# Patient Record
Sex: Female | Born: 1990 | Race: Black or African American | Hispanic: No | Marital: Single
Health system: Midwestern US, Community
[De-identification: ages and names within clinical notes are randomized; demographics above are authoritative.]

## PROBLEM LIST (undated history)

## (undated) DIAGNOSIS — S59902A Unspecified injury of left elbow, initial encounter: Secondary | ICD-10-CM

## (undated) DIAGNOSIS — F329 Major depressive disorder, single episode, unspecified: Secondary | ICD-10-CM

## (undated) DIAGNOSIS — F32A Depression, unspecified: Secondary | ICD-10-CM

## (undated) HISTORY — DX: Major depressive disorder, single episode, unspecified: F32.9

## (undated) HISTORY — DX: Depression, unspecified: F32.A

---

## 2014-12-10 ENCOUNTER — Inpatient Hospital Stay
Admit: 2014-12-10 | Discharge: 2014-12-10 | Disposition: A | Discharge status: Home / Self Care | Payer: BLUE CROSS/BLUE SHIELD | Attending: Emergency Medical Services

## 2014-12-10 DIAGNOSIS — R1084 Generalized abdominal pain: Secondary | ICD-10-CM

## 2014-12-10 LAB — CBC WITH AUTOMATED DIFF
BASOPHILS: 0.3 % (ref 0–3)
EOSINOPHILS: 2.4 % (ref 0–5)
HCT: 37.5 % (ref 37.0–50.0)
HGB: 13.1 gm/dl (ref 13.0–17.2)
IMMATURE GRANULOCYTES: 0.3 % (ref 0.0–3.0)
LYMPHOCYTES: 28.4 % (ref 28–48)
MCH: 31.6 pg (ref 25.4–34.6)
MCHC: 34.9 gm/dl (ref 30.0–36.0)
MCV: 90.4 fL (ref 80.0–98.0)
MONOCYTES: 12.6 % (ref 1–13)
MPV: 9.8 fL (ref 6.0–10.0)
NEUTROPHILS: 56 % (ref 34–64)
NRBC: 0 (ref 0–0)
PLATELET: 224 10*3/uL (ref 140–450)
RBC: 4.15 M/uL (ref 3.60–5.20)
RDW-SD: 44.7 (ref 36.4–46.3)
WBC: 7 10*3/uL (ref 4.0–11.0)

## 2014-12-10 LAB — METABOLIC PANEL, COMPREHENSIVE
ALT (SGPT): 21 U/L (ref 12–78)
AST (SGOT): 15 U/L (ref 15–37)
Albumin: 3.7 gm/dl (ref 3.4–5.0)
Alk. phosphatase: 102 U/L (ref 45–117)
BUN: 8 mg/dl (ref 7–25)
Bilirubin, total: 0.5 mg/dl (ref 0.2–1.0)
CO2: 25 mEq/L (ref 21–32)
Calcium: 9.4 mg/dl (ref 8.5–10.1)
Chloride: 106 mEq/L (ref 98–107)
Creatinine: 0.8 mg/dl (ref 0.6–1.3)
GFR est AA: >60
GFR est non-AA: >60
Glucose: 77 mg/dl (ref 74–106)
Potassium: 3.9 mEq/L (ref 3.5–5.1)
Protein, total: 8.2 gm/dl (ref 6.4–8.2)
Sodium: 139 mEq/L (ref 136–145)

## 2014-12-10 LAB — LIPASE: Lipase: 186 U/L (ref 73–393)

## 2014-12-10 LAB — POC URINE MACROSCOPIC
Bilirubin: NEGATIVE
Glucose: NEGATIVE mg/dl
Ketone: NEGATIVE mg/dl
Leukocyte Esterase: NEGATIVE
Nitrites: NEGATIVE
Protein: NEGATIVE mg/dl
Specific gravity: >=1.03 (ref 1.005–1.030)
Urobilinogen: 1 EU/dl (ref 0.0–1.0)
pH (UA): 7.5 (ref 5–9)

## 2014-12-10 LAB — POC FECAL OCCULT BLOOD
Occult blood, stool (POC): NEGATIVE
Occult blood, stool: NEGATIVE — AB

## 2014-12-10 LAB — POC HCG,URINE: HCG urine, QL: NEGATIVE

## 2014-12-10 MED ORDER — DICYCLOMINE 10 MG CAP
10 mg | ORAL | Status: AC
Start: 2014-12-10 — End: 2014-12-10
  Administered 2014-12-10: 16:00:00 via ORAL

## 2014-12-10 MED ORDER — ONDANSETRON (PF) 4 MG/2 ML INJECTION
4 mg/2 mL | Freq: Once | INTRAMUSCULAR | Status: AC
Start: 2014-12-10 — End: 2014-12-10
  Administered 2014-12-10: 16:00:00 via INTRAVENOUS

## 2014-12-10 MED ORDER — ONDANSETRON 4 MG TAB, RAPID DISSOLVE
4 mg | ORAL_TABLET | Freq: Three times a day (TID) | ORAL | Status: DC | PRN
Start: 2014-12-10 — End: 2019-05-07

## 2014-12-10 MED ORDER — SODIUM CHLORIDE 0.9 % IJ SYRG
Freq: Once | INTRAMUSCULAR | Status: AC
Start: 2014-12-10 — End: 2014-12-10
  Administered 2014-12-10: 16:00:00 via INTRAVENOUS

## 2014-12-10 MED ORDER — DICYCLOMINE 20 MG TAB
20 mg | ORAL_TABLET | Freq: Four times a day (QID) | ORAL | Status: DC
Start: 2014-12-10 — End: 2019-05-07

## 2014-12-10 MED FILL — ONDANSETRON (PF) 4 MG/2 ML INJECTION: 4 mg/2 mL | INTRAMUSCULAR | Qty: 2

## 2014-12-10 MED FILL — DICYCLOMINE 10 MG CAP: 10 mg | ORAL | Qty: 2

## 2014-12-10 NOTE — ED Notes (Signed)
One month h/o constipation, has tried laxatives, on Friday started having pasty hard black stools, only small like pebbles. Abdominal pain x 1 month, nausea

## 2014-12-10 NOTE — ED Provider Notes (Signed)
[facility]  EMERGENCY DEPARTMENT TREATMENT REPORT  NAME:  Mandy Chen  SEX:   F  ADMIT: 12/10/2014  DOB:   May 24, 1990  MR#    [id number]  ROOM:  AV40  TIME DICTATED: 11 30 AM  ACCT#  [id number]        TIME OF EVALUATION:  1000    CHIEF COMPLAINT:  Constipation, abdominal pain, black stool.    HISTORY OF PRESENT ILLNESS:  This 24 year old female complains of abdominal pain and constipation for the   past month.   Sporadically throughout the day she has generalized crampy,   sharp pain to her abdomen.  Pain occurs spontaneously, lasts for several hours   before self-resolving.  No identifiable exacerbating factors.  The pain does   not worsen with food.  Associated nausea, but no vomiting and she has been   eating and drinking well.  Pain does resolve with Pepto-Bismol which she has   been taking almost daily.  Associated constipation.  Does move her bowels once   a day or once every other day, but bowel movements are extremely small.    Stool is soft.   Feels like she should have larger bowel movements given the   amount of food she eats a day.   Has tried over-the-counter Dulcolax and   increasing the fiber in her diet with no real change.  Two days ago noticed   that her stool began to look black, which was very concerning, and why she has   finally come to the Emergency Department today.  Since starting college in   2015 she has occasionally had similar type abdominal discomfort and she always   related it to possible stress or anxiety, but has never been this severe or   this persistent.    REVIEW OF SYSTEMS:  CONSTITUTIONAL:  No fever, chills, or sweats.  GASTROINTESTINAL:  As above.  No diarrhea.  GENITOURINARY:  No hematochezia.  GENITOURINARY:  No dysuria, frequency, or urgency.    MUSCULOSKELETAL:  No back or flank pain.    PAST MEDICAL HISTORY:  Anxiety, depression, seasonal allergies.    SOCIAL HISTORY:  No tobacco use, consumes alcohol occasionally.  Denies recreational drug use.     Last menstrual period 12/06/2014.  Currently on her menstrual cycle.    ALLERGIES:  NONE.    MEDICATIONS:  Lexapro, Wellbutrin, Flonase, One-A-Day multivitamin.    PHYSICAL EXAMINATION:  VITAL SIGNS:  BP 141/60, pulse 96, respirations 18, temperature 98.4, pain 10,   O2 saturation 100% on room air.  GENERAL APPEARANCE:  Well developed, well nourished.  HEENT:   Eyes:  Conjunctivae clear, lids normal.  Pupils equal, symmetrical,   and normally reactive.  Mouth/Throat:  Surfaces of the pharynx, palate, and   tongue are pink, moist, and without lesions.    RESPIRATORY:   Lungs clear.  CARDIOVASCULAR:  Regular rate and rhythm.  GASTROINTESTINAL:  Abdomen is soft,  nondistended, active bowel sounds present   in all 4 quadrants.  Abdomen nontender with palpation in all 4 quadrants.  No   rebound, guarding or peritonitis.  No abdominal or inguinal masses   appreciated by inspection or palpation.  No hepatomegaly or splenomegaly.    Bilaterally, no CVA tenderness.  RECTAL:  No masses or hemorrhoids.  Sphincter tone is normal.  No large amount   of stool is appreciable in the rectal vault.  Stool is black-appearing,   guaiac negative.  MUSCULOSKELETAL:  Moves all 4 extremities spontaneously.  SKIN:  Warm and dry.  Mucous membranes have good pink undertone.    CONTINUATION BY Verlin GrillsOLLEEN OLEARY, PA:      ASSESSMENT AND MANAGEMENT PLAN:    This is a new problem for this patient.      DIAGNOSTIC INTERPRETATIONS:  Urine pregnancy negative.  Urine microscopic large blood, otherwise   unremarkable.  CBC, CMP and lipase all unremarkable.    COURSE IN THE EMERGENCY DEPARTMENT:  The patient looks well.  Presents concerned for constipation.  She has   actually been having a bowel movement almost daily, although does sound like   the amount of stool that is being passed is less than her norm.  On rectal   exam, a small amount of stool that was present did look black, but it was    guaiac negative.  I suspect her stools have turned black from Pepto-Bismol   use.  There is not a significant amount of stool in her rectal vault, so I do   not think she would benefit from an enema.  She was not having any abdominal   pain on my initial exam and her abdomen was completely benign.  I am going to   check a CBC, CMP, lipase and urine to assess for any signs of infectious   process, rule out pancreatitis, check her LFTs, her kidney function and   electrolytes.  Urine had large blood which is to be expected given that she is   currently on her menstrual cycle.  Urine was negative for infection.  All of   her blood work was also unremarkable.      Medicated during her ER stay with 4 mg Zofran IV and 20 mg of Bentyl p.o.  She   tolerated the medicine without any issues.  When I reexamined her, she   continued to look great in the room and had no return of her abdominal pain   throughout her stay.  I reexamined her abdomen and it was still completely   benign.  I discussed with her and her mom, who is at bedside, that given her   benign abdomen, how well she looks and her unremarkable workup, I have no   suspicion for any acute or life-threatening intra-abdominal process.  I do not   suspect a GI bleed or a bowel obstruction.  I have recommended that she   follow up with a gastroenterologist for further evaluation.  She should   encourage fluids at home.  We will discharge her with Bentyl and Zofran   prescriptions as needed for additional abdominal pain and nausea.  They are   both very reassured, happy and agreeable with this plan.    DIAGNOSES:    Generalized abdominal pain.    FINAL DIAGNOSIS:   Nausea without vomiting.    DISPOSITION AND PLAN:  The patient discharged home in stable condition with verbal instructions for   ongoing care.  Instructed to follow up with referral gastroenterologist as   soon as possible for further evaluation.  They are aware they may return at    any time for new or worsening symptoms.  The patient was personally evaluated   by myself and Dr. Damien Fusiavid Nikkole Placzek, who agrees with the above assessment and   plan.      ___________________  Elsie Saasavid A Arnesia Vincelette MD  Dictated By: Verlin Grillsolleen Oleary, PA    My signature above authenticates this document and my orders, the final   diagnosis (es), discharge prescription (s),  and instructions in the Epic   record.  If you have any questions please contact [phone].    Nursing notes have been reviewed by the physician/ advanced practice   clinician.    [facility]  D:12/10/2014 11:30:16  T: 12/10/2014 16:15:48  [id number]

## 2015-12-25 ENCOUNTER — Encounter: Payer: Self-pay | Admitting: Podiatry

## 2015-12-25 ENCOUNTER — Telehealth: Payer: Self-pay | Admitting: *Deleted

## 2015-12-25 ENCOUNTER — Ambulatory Visit (INDEPENDENT_AMBULATORY_CARE_PROVIDER_SITE_OTHER): Payer: Federal, State, Local not specified - PPO

## 2015-12-25 ENCOUNTER — Ambulatory Visit (INDEPENDENT_AMBULATORY_CARE_PROVIDER_SITE_OTHER): Payer: Federal, State, Local not specified - PPO | Admitting: Podiatry

## 2015-12-25 VITALS — BP 90/55 | HR 73 | Resp 16

## 2015-12-25 DIAGNOSIS — M2141 Flat foot [pes planus] (acquired), right foot: Secondary | ICD-10-CM | POA: Diagnosis not present

## 2015-12-25 DIAGNOSIS — M66871 Spontaneous rupture of other tendons, right ankle and foot: Secondary | ICD-10-CM

## 2015-12-25 DIAGNOSIS — M79671 Pain in right foot: Secondary | ICD-10-CM

## 2015-12-25 DIAGNOSIS — M76821 Posterior tibial tendinitis, right leg: Secondary | ICD-10-CM

## 2015-12-25 NOTE — Progress Notes (Signed)
   Subjective:    Patient ID: Ann Mitchell, female    DOB: 02/11/1991, 25 y.o.   MRN: [id number]  HPI: She presents today as a 25 year old female with pain to her right foot along the medial longitudinal aspect of the foot. She states it is then aching on and off the sharp pains for the past several months with some swelling and she states that it seems to be worse after sitting and driving for a protracted period of time. She's tried Dr. Margart SicklesScholl's over-the-counter insoles and bracing nothing seems to help. She denies any trauma.    Review of Systems  Musculoskeletal: Positive for back pain.  Allergic/Immunologic: Positive for environmental allergies and food allergies.  All other systems reviewed and are negative.      Objective:   Physical Exam: Vital signs are stable she is alert and oriented 3 no apparent distress. Pulses are strongly palpable. Neurologic sensorium is intact. Deep tendon reflexes are intact muscle strength +5 over 5 dorsiflexion plantar flexors and inverters everters MUSCULATURES INTACT. ORTHOPEDIC EVALUATION DEMONSTRATES ALL JOINTS IS FULL RANGE OF MOTION WITHOUT CREPITATION. THERE SHE DOES HAVE SEVERE PAIN ON PALPATION OF THE POSTERIOR TIBIAL TENDON AT ITS INSERTION SITE ON THE NAVICULAR TUBEROSITY WITH AN AREA OF FLUCTUANCE JUST DISTAL TO THAT. RADIOGRAPHS TAKEN DO DEMONSTRATE A SOFT TISSUE INCREASE IN DENSITY ALONG THE POSTERIOR TIBIAL COURSE. SHE HAS A VERY LARGE NAVICULAR TUBEROSITY AS WELL. PES PLANUS TO THE RIGHT FOOT LESS DEGREE TO THE LEFT FOOT.          Assessment & Plan:  Assessment: Probable tear of the posterior tibial tendon of the right foot. Pes planus right foot.  Plan: We are requesting an MRI at this point secondary to the chronic pain and stability and pes planus.

## 2015-12-25 NOTE — Telephone Encounter (Signed)
[employer] DOES NOT REQUIRE PRIOR AUTHORIZATION FOR [id number], REFERENCE #[id number]. FAXED TO [facility].

## 2016-01-09 ENCOUNTER — Other Ambulatory Visit: Payer: Self-pay

## 2016-01-16 ENCOUNTER — Ambulatory Visit
Admission: RE | Admit: 2016-01-16 | Discharge: 2016-01-16 | Disposition: A | Discharge status: Home / Self Care | Payer: Federal, State, Local not specified - PPO | Source: Ambulatory Visit | Attending: Podiatry | Admitting: Podiatry

## 2016-01-16 DIAGNOSIS — M66871 Spontaneous rupture of other tendons, right ankle and foot: Secondary | ICD-10-CM

## 2016-01-16 DIAGNOSIS — M76821 Posterior tibial tendinitis, right leg: Secondary | ICD-10-CM

## 2016-01-25 ENCOUNTER — Telehealth: Payer: Self-pay | Admitting: *Deleted

## 2016-01-25 NOTE — Telephone Encounter (Addendum)
-----   Message from Elinor ParkinsonMax T Hyatt, [place] sent at 01/19/2016  4:49 PM EDT ----- Please send for an over read and inform patient of the delay. 01/25/2016-Left message with Dr. Geryl RankinsHyatt's orders and explanation of delay. Mailed MRI disc copy to [facility]. 06/11/2016-Pt states she paid for her medicine in December and wanted to know when it would go through her pharmacy. Pt states she still can't wear her orthotics. 06/12/2016-Left message informing pt I had done research and was not sure what medication she had paid for in December and not received, and to call again.

## 2016-02-07 ENCOUNTER — Telehealth: Payer: Self-pay | Admitting: Podiatry

## 2016-02-07 NOTE — Telephone Encounter (Signed)
lvm for pt to call to schedule an appt to see Dr Al CorpusHyatt to discuss mri results.

## 2016-02-13 ENCOUNTER — Telehealth: Payer: Self-pay | Admitting: Podiatry

## 2016-02-13 NOTE — Telephone Encounter (Signed)
lvm for pt to call to schedule an a follow up appt

## 2016-02-15 ENCOUNTER — Encounter: Payer: Self-pay | Admitting: Podiatry

## 2016-02-21 ENCOUNTER — Encounter: Payer: Self-pay | Admitting: Podiatry

## 2016-02-21 ENCOUNTER — Ambulatory Visit (INDEPENDENT_AMBULATORY_CARE_PROVIDER_SITE_OTHER): Payer: Federal, State, Local not specified - PPO | Admitting: Podiatry

## 2016-02-21 DIAGNOSIS — M2141 Flat foot [pes planus] (acquired), right foot: Secondary | ICD-10-CM

## 2016-02-21 DIAGNOSIS — M7751 Other enthesopathy of right foot: Secondary | ICD-10-CM

## 2016-02-21 DIAGNOSIS — M76821 Posterior tibial tendinitis, right leg: Secondary | ICD-10-CM | POA: Diagnosis not present

## 2016-02-21 NOTE — Progress Notes (Signed)
She presents today for her MRI report right foot. She states that her foot is still sore right here and she points to the plantar aspect of the navicular tuberosity she's also complaining of pain to the dorsal lateral aspect of the foot overlying the fourth and fifth metatarsal cuboid articulation.  Objective: Vital signs are stable she is alert and oriented 3 still stable palpation of these 2 sites. Final plane range of motion. MRI states pes planus with posterior tibial tendinitis and osteoarthritis of the talonavicular joint and the navicular cuneiform joint. It does not identify any lateral pathology.  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  Plan: I I injected the site of maximal tenderness at the navicular tuberosity as well as the dorsal lateral aspect of the right foot. Placed her in a pair of insoles and she was scanned for orthotics. I will follow-up with her in the near future.

## 2016-03-18 ENCOUNTER — Ambulatory Visit: Payer: Federal, State, Local not specified - PPO | Admitting: Podiatry

## 2016-03-25 ENCOUNTER — Ambulatory Visit (INDEPENDENT_AMBULATORY_CARE_PROVIDER_SITE_OTHER): Payer: Federal, State, Local not specified - PPO | Admitting: Podiatry

## 2016-03-25 ENCOUNTER — Encounter: Payer: Self-pay | Admitting: Podiatry

## 2016-03-25 DIAGNOSIS — M76821 Posterior tibial tendinitis, right leg: Secondary | ICD-10-CM | POA: Diagnosis not present

## 2016-03-25 DIAGNOSIS — M2141 Flat foot [pes planus] (acquired), right foot: Secondary | ICD-10-CM

## 2016-03-25 DIAGNOSIS — M7751 Other enthesopathy of right foot: Secondary | ICD-10-CM

## 2016-03-25 MED ORDER — CELECOXIB 200 MG PO CAPS: 200.0000 mg | ORAL_CAPSULE | Freq: Two times a day (BID) | ORAL | 3 refills | Status: DC

## 2016-03-25 NOTE — Patient Instructions (Signed)

## 2016-03-25 NOTE — Progress Notes (Signed)
She presents today for follow-up of her posterior tibial tendinitis. She states that the shots really seem to help and the injections are making it better as well as inserts. She is here today to pick up her orthotics.  Objective: Vital signs are stable she's alert and oriented 3. Pulses are palpable. She has tenderness on palpation of the posterior tibial tendon as it inserts on the navicular tuberosity right greater than left.  Assessment: Posterior tibial tendinitis bilateral.  Plan: She pick up her orthotics today should provide Vicryl and the posterior Dallas Schimkeairns use of her orthotics I'll follow up with her in 6 weeks.

## 2016-04-01 ENCOUNTER — Ambulatory Visit: Payer: Federal, State, Local not specified - PPO | Admitting: Podiatry

## 2016-06-16 ENCOUNTER — Encounter: Payer: Self-pay | Admitting: Podiatry

## 2016-06-16 ENCOUNTER — Telehealth: Payer: Self-pay | Admitting: *Deleted

## 2016-06-16 ENCOUNTER — Ambulatory Visit (INDEPENDENT_AMBULATORY_CARE_PROVIDER_SITE_OTHER): Payer: Federal, State, Local not specified - PPO | Admitting: Podiatry

## 2016-06-16 DIAGNOSIS — M76821 Posterior tibial tendinitis, right leg: Secondary | ICD-10-CM

## 2016-06-16 MED ORDER — CELECOXIB 200 MG PO CAPS: 200.0000 mg | ORAL_CAPSULE | Freq: Two times a day (BID) | ORAL | 1 refills | Status: DC

## 2016-06-16 NOTE — Telephone Encounter (Signed)
[facility], and gave to [employer] [place] for [employer].

## 2016-06-16 NOTE — Progress Notes (Signed)
She presents today for follow-up of her painful lesion to the medial aspect of the right foot. Sheri history of posterior tibial tendinitis previously and she states that might bump has come back now and it is painful once again she states that it had resolved and one time and is now once again paid ankle. She likes to consider taking Celebrex again.  Objective: Vital signs are stable alert and oriented 3. Pulses are palpable. Neurologic sensory is intact. Deep tendon reflexes are intact she has some tenderness on palpation posterior tibial tendon though she has what appears to be a soft tissue vascular lesion that appears to be phlebitic or thrombosed along the area of the navicular tuberosity and just inferior to that region. There is no overlying erythema cellulitis drainage or odor. Radiographs do not demonstrate any Major abnormality in this area.  Assessment: Distal posterior tibial tendinitis with vascular lesion.  Plan: At this point I'm requesting an MRI of the posterior tibial tendon with contrast to make sure that this lesion is not involved in the tendon at all.

## 2016-07-05 ENCOUNTER — Other Ambulatory Visit: Payer: Federal, State, Local not specified - PPO

## 2016-07-12 ENCOUNTER — Other Ambulatory Visit: Payer: Federal, State, Local not specified - PPO

## 2017-01-08 IMAGING — MR MR FOOT*R* W/O CM
4 of 9 series · 14 of 40 positions shown · non-contrast
Comparison: None.

CLINICAL DATA: Chronic medial pain. Pain while walking and running.

EXAM:
MRI OF THE RIGHT FOREFOOT WITHOUT CONTRAST
TECHNIQUE: Multiplanar, multisequence MR imaging was performed. No intravenous
contrast was administered.

[Series 3: T2 fat-sat · axial · 4.0mm · 0.29mm/px · z∈[-43,+58]mm · 4 of 24 slices shown (1 of 3)]
[im 1/24]
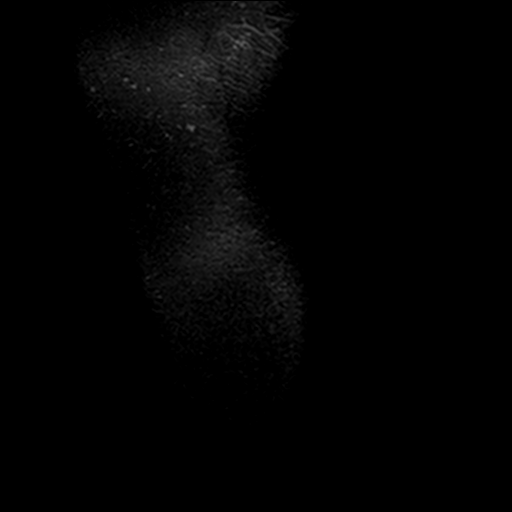
[im 6/24]
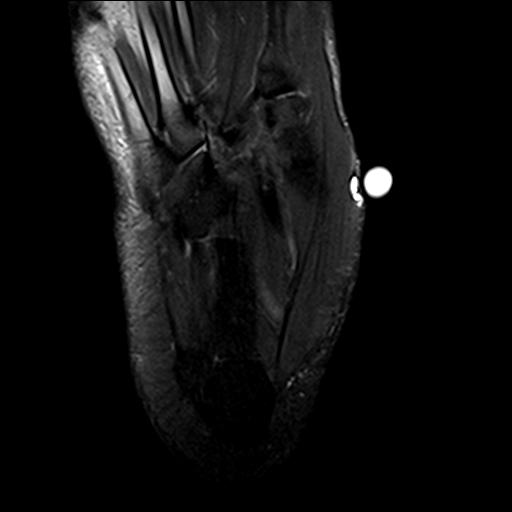
[im 12/24]
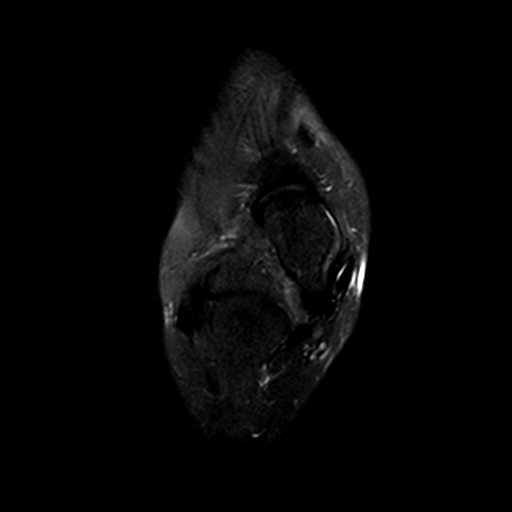
[im 24/24]
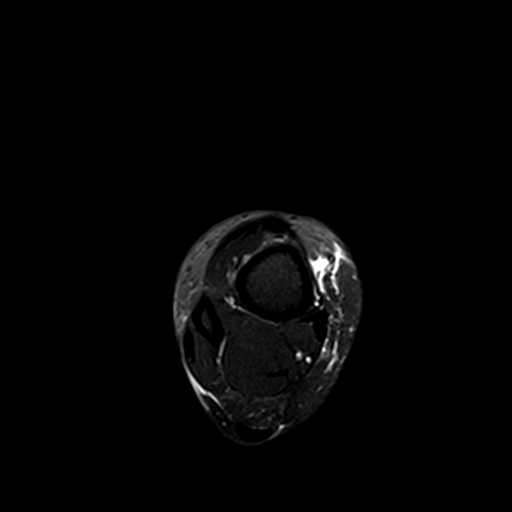

[Series 7: T2 fat-sat · coronal · 4.0mm · 0.33mm/px · 3 of 29 slices shown (2 of 3)]
[im 1/29]
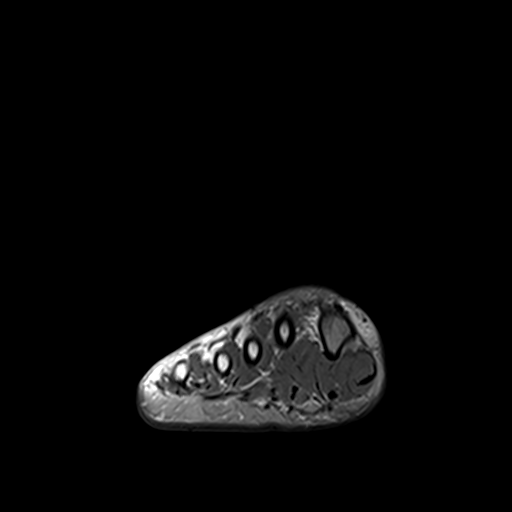
[im 15/29]
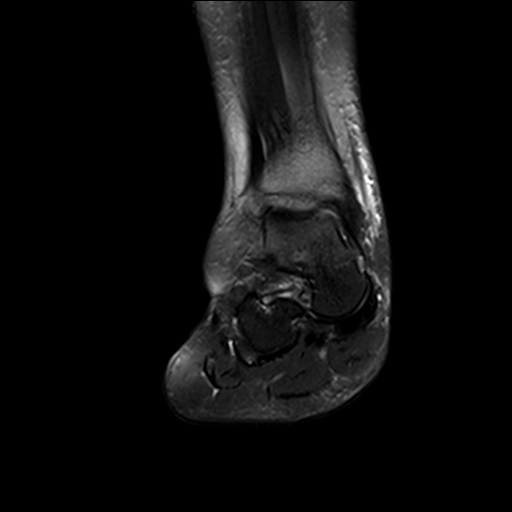
[im 29/29]
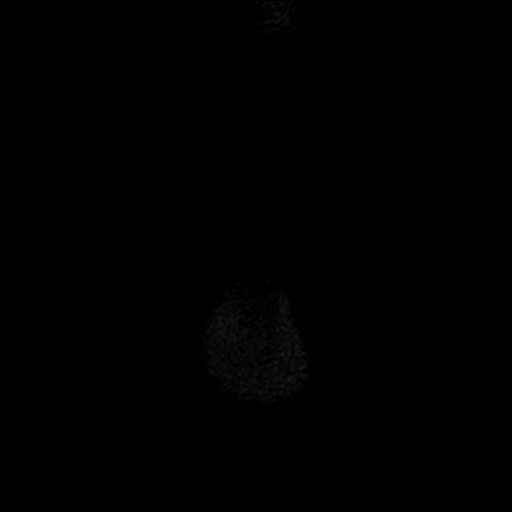

[Series 8: T2 fat-sat · sagittal · 3.0mm · 0.29mm/px · 3 of 21 slices shown (3 of 3)]
[im 1/21]
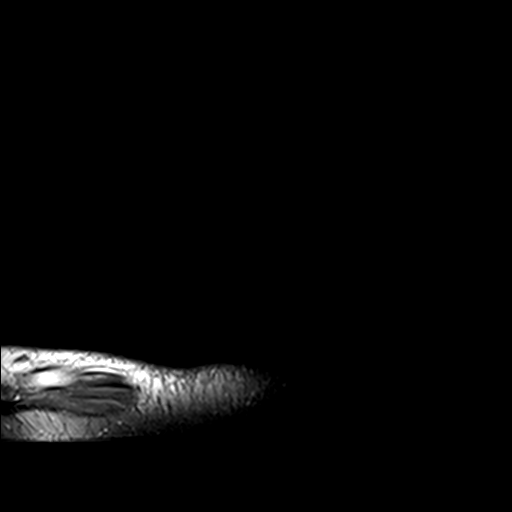
[im 14/21]
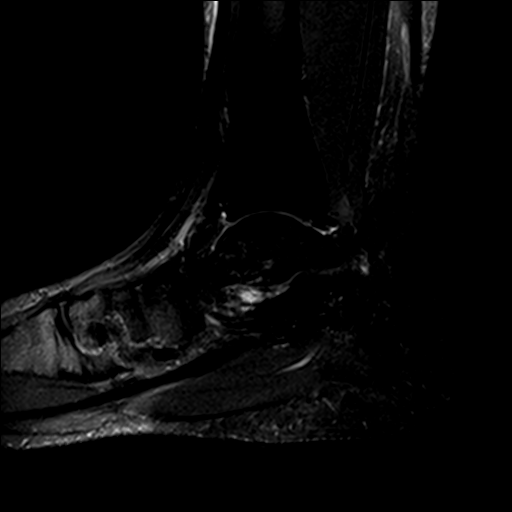
[im 21/21]
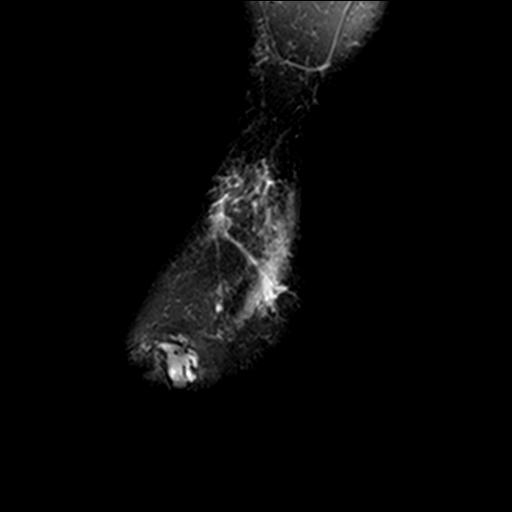

[Series 11: PD fat-sat · axial · right · 4.0mm · 0.20mm/px · z∈[-54,+51]mm · 4 of 23 slices shown]
[im 1/23]
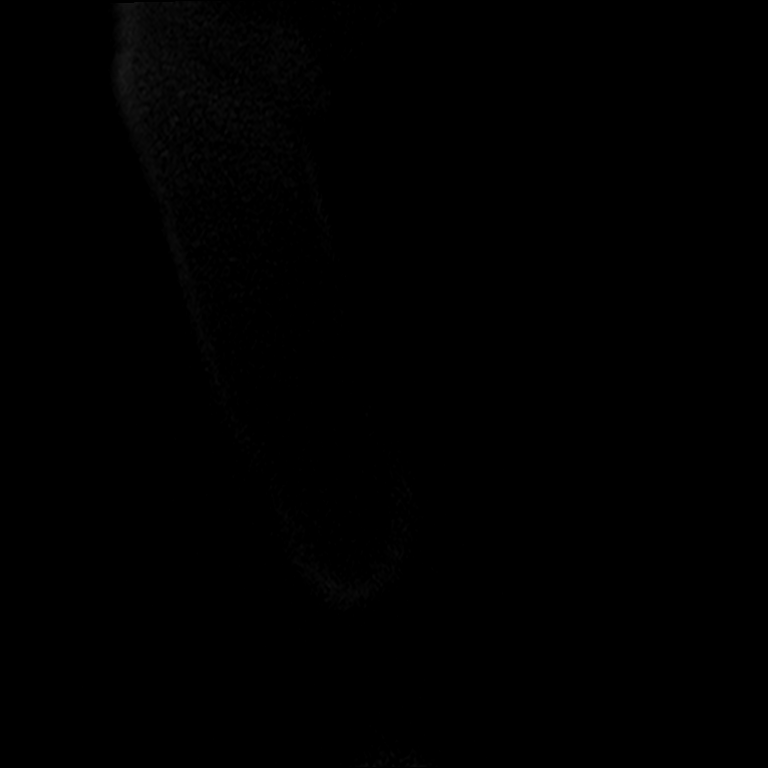
[im 8/23]
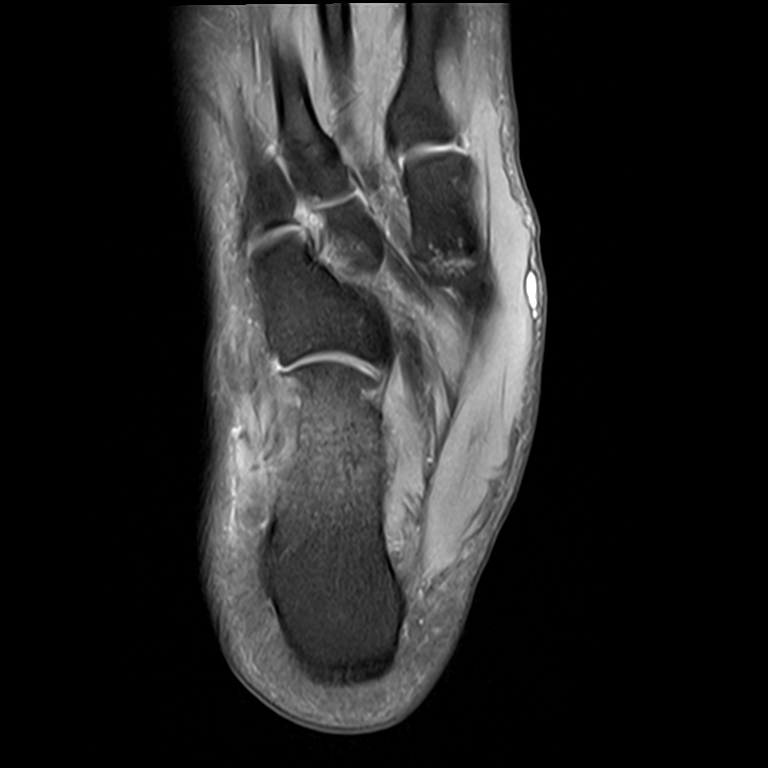
[im 15/23]
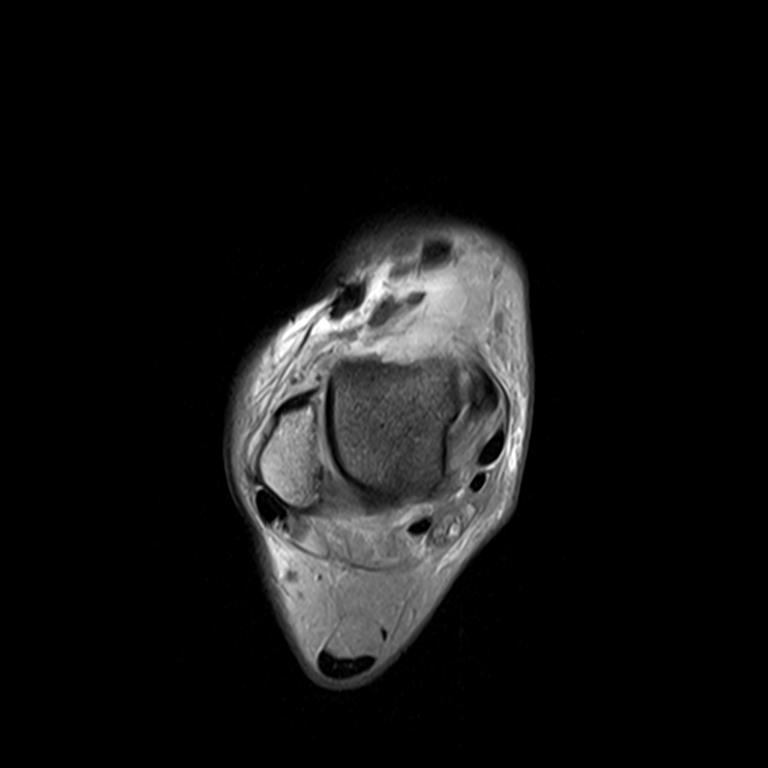
[im 23/23]
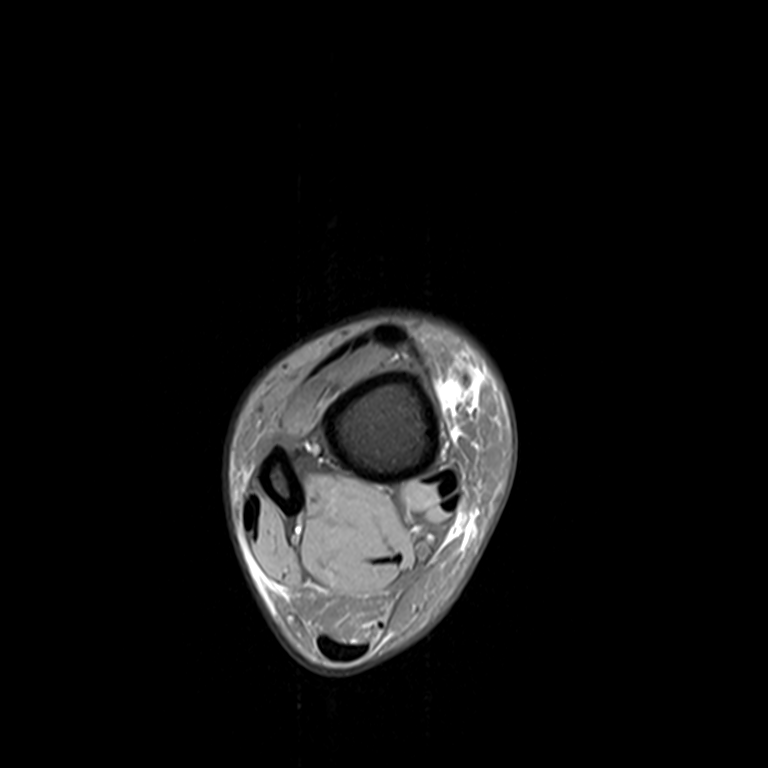

[14 of 40 positions shown; findings below may reference images not displayed]

FINDINGS: TENDONS

Peroneal: Peroneal longus tendon intact. Peroneal brevis intact.

Posteromedial: Posterior tibial tendon intact. Flexor hallucis
longus tendon intact. Flexor digitorum longus tendon intact.

Anterior: Tibialis anterior tendon intact. Extensor hallucis longus
tendon intact Extensor digitorum longus tendon intact.

Achilles:  Intact.

Plantar Fascia: Intact.

LIGAMENTS

Lateral: Anterior talofibular ligament intact. Calcaneofibular
ligament intact. Posterior talofibular ligament intact. Anterior and
posterior tibiofibular ligaments intact.

Medial: Deltoid ligament intact. Spring ligament intact.

CARTILAGE

Ankle Joint: No joint effusion. Normal ankle mortise. No chondral
defect.

Subtalar Joints/Sinus Tarsi: Normal subtalar joints. No subtalar
joint effusion. Normal sinus tarsi.

Bones: Mild osteoarthritis of the plantar aspect of the
navicular-medial cuneiform joint. Relative pes planus.

Soft Tissue: Skin marker along the medial aspect of the midfoot. At
the site of the skin marker there is a prominent vessels superficial
to the abductor hallucis muscle.
IMPRESSION: 1. Flexor, extensor and peroneal tendons are intact.
2. Relative pes planus.
3. Mild osteoarthritis of the plantar aspect of the navicular-medial
cuneiform joint.
4. Skin marker along the medial aspect of the midfoot. At the site
of the skin marker there is a prominent vessels superficial to the
abductor hallucis muscle.

## 2017-02-03 ENCOUNTER — Ambulatory Visit: Payer: Federal, State, Local not specified - PPO | Admitting: Podiatry

## 2017-08-04 ENCOUNTER — Ambulatory Visit: Payer: Federal, State, Local not specified - PPO | Admitting: Podiatry

## 2017-08-04 DIAGNOSIS — M722 Plantar fascial fibromatosis: Secondary | ICD-10-CM

## 2017-08-04 DIAGNOSIS — M6740 Ganglion, unspecified site: Secondary | ICD-10-CM

## 2017-08-04 DIAGNOSIS — M76821 Posterior tibial tendinitis, right leg: Secondary | ICD-10-CM | POA: Diagnosis not present

## 2017-08-04 NOTE — Patient Instructions (Signed)
Pre-Operative Instructions  Congratulations, you have decided to take an important step towards improving your quality of life.  You can be assured that the doctors and staff at [facility] will be with you every step of the way.  Here are some important things you should know:  1. Plan to be at the surgery center/hospital at least 1 (one) hour prior to your scheduled time, unless otherwise directed by the surgical center/hospital staff.  You must have a responsible adult accompany you, remain during the surgery and drive you home.  Make sure you have directions to the surgical center/hospital to ensure you arrive on time. 2. If you are having surgery at [facility] or [facility] hospitals, you will need a copy of your medical history and physical form from your family physician within one month prior to the date of surgery. We will give you a form for your primary physician to complete.  3. We make every effort to accommodate the date you request for surgery.  However, there are times where surgery dates or times have to be moved.  We will contact you as soon as possible if a change in schedule is required.   4. No aspirin/ibuprofen for one week before surgery.  If you are on aspirin, any non-steroidal anti-inflammatory medications (Mobic, Aleve, Ibuprofen) should not be taken seven (7) days prior to your surgery.  You make take Tylenol for pain prior to surgery.  5. Medications - If you are taking daily heart and blood pressure medications, seizure, reflux, allergy, asthma, anxiety, pain or diabetes medications, make sure you notify the surgery center/hospital before the day of surgery so they can tell you which medications you should take or avoid the day of surgery. 6. No food or drink after midnight the night before surgery unless directed otherwise by surgical center/hospital staff. 7. No alcoholic beverages 24-hours prior to surgery.  No smoking 24-hours prior or 24-hours after  surgery. 8. Wear loose pants or shorts. They should be loose enough to fit over bandages, boots, and casts. 9. Don't wear slip-on shoes. Sneakers are preferred. 10. Bring your boot with you to the surgery center/hospital.  Also bring crutches or a walker if your physician has prescribed it for you.  If you do not have this equipment, it will be provided for you after surgery. 11. If you have not been contacted by the surgery center/hospital by the day before your surgery, call to confirm the date and time of your surgery. 12. Leave-time from work may vary depending on the type of surgery you have.  Appropriate arrangements should be made prior to surgery with your employer. 13. Prescriptions will be provided immediately following surgery by your doctor.  Fill these as soon as possible after surgery and take the medication as directed. Pain medications will not be refilled on weekends and must be approved by the doctor. 14. Remove nail polish on the operative foot and avoid getting pedicures prior to surgery. 15. Wash the night before surgery.  The night before surgery wash the foot and leg well with water and the antibacterial soap provided. Be sure to pay special attention to beneath the toenails and in between the toes.  Wash for at least three (3) minutes. Rinse thoroughly with water and dry well with a towel.  Perform this wash unless told not to do so by your physician.  Enclosed: 1 Ice pack (please put in freezer the night before surgery)   1 Hibiclens skin cleaner     Pre-op instructions  If you have any questions regarding the instructions, please do not hesitate to call our office.  [place]: [address], [place], [place] [address] -- [phone]  [place]: [address] [place], [place] [address] -- [phone]  [place]: [address]  [place], [place] [address] -- [phone]  [place]: [address], [address], [place], [place] [address] -- [phone]  Website:  [email or web address] 

## 2017-08-05 ENCOUNTER — Other Ambulatory Visit: Payer: Self-pay | Admitting: Podiatry

## 2017-08-05 MED ORDER — DICLOFENAC SODIUM 75 MG PO TBEC: 75.0000 mg | DELAYED_RELEASE_TABLET | Freq: Two times a day (BID) | ORAL | 3 refills | Status: AC

## 2017-08-05 NOTE — Progress Notes (Signed)
She presents today for follow-up of pain to the right foot and ankle.  She states that she still has a small lesion that is painful in the medial aspect.  She states that it is something that annoys her on a daily basis particularly with shoe gear.  She feels that it may be one reason she has pain in that foot.  She states that she still has pain in here she points to the posterior tibial tendon.  She would like to consider another pair of orthotics since the last set she had made was in 2017.  Objective: Vital signs are stable alert and oriented x3.  Pulses are palpable.  Neurologic sensorium is intact.  DP reflexes are intact.  Muscle strength is normal symmetrical bilateral though she does have tenderness on inversion of the right foot with use of the posterior tibial tendon she also has tenderness on palpation of this tendon as it courses beneath the medial malleolus extending to the navicular tuberosity.  Just distal to that there is a small 1/2 cm x 1/2 cm nonpulsatile irregular   nodule relatively firm possible ganglion or hard tumor.  This is tender on palpation.  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  Chronic posterior tibial tendinitis right.  Plan: Discussed etiology pathology conservative versus surgical therapies.  At this time we have chosen to surgically remove the small mass to be sent for pathologic evaluation.  [facility] with [facility] sedation and local anesthetic.  We consented her today for this excision of soft tissue tumor.  She understands possible postop complications that may include but not limited to postop pain bleeding swelling infection recurrence need further surgery overcorrection under correction.  We dispensed paperwork regarding the surgery center as well as the anesthesia group.  We also dispensed a Darco shoe today.  She was also scanned for new set of orthotics.

## 2017-08-06 ENCOUNTER — Telehealth: Payer: Self-pay | Admitting: *Deleted

## 2017-08-06 NOTE — Telephone Encounter (Signed)
Order request fo celebrex. I reviewed LOV 08/04/2017 Celebrex was cancelled and diclofenac was ordered on 08/05/2017. Return fax denying.

## 2017-08-24 ENCOUNTER — Ambulatory Visit: Payer: Federal, State, Local not specified - PPO | Admitting: Orthotics

## 2017-08-24 DIAGNOSIS — M722 Plantar fascial fibromatosis: Secondary | ICD-10-CM

## 2017-08-24 DIAGNOSIS — M76821 Posterior tibial tendinitis, right leg: Secondary | ICD-10-CM

## 2017-08-28 NOTE — Progress Notes (Signed)
Patient came in today to pick up custom made foot orthotics.  The goals were accomplished and the patient reported no dissatisfaction with said orthotics.  Patient was advised of breakin period and how to report any issues. 

## 2017-10-27 ENCOUNTER — Telehealth: Payer: Self-pay | Admitting: *Deleted

## 2017-10-27 NOTE — Telephone Encounter (Addendum)
"  I saw Dr. Al CorpusHyatt a few weeks ago.  I want to see if he has any time available in October or November."  Yes, he has time available in both months.  He cannot do anything on October 25 nor November 29.  "Okay, let me check with my mom because she will be bringing me and I will call you back."  "I would like to have surgery done on October 11."  I'll get it scheduled.  You need to go on-line and register with the surgery center, instructions are in the brochure that was given to you.  Someone from the surgical center will call you a day or two prior to your surgery date and they will give you your arrival time.  (Routed wrong person, please disregard)

## 2017-10-29 ENCOUNTER — Telehealth: Payer: Self-pay | Admitting: *Deleted

## 2017-10-29 NOTE — Telephone Encounter (Signed)
"  I scheduled surgery for October 11.  I went to the portal to register and it's telling me the link has expired."  You may have received an old brochure.  Go to [email or web address] and click on the pre-registration link then the one medical passport link.  "Okay, thank you so much."

## 2017-11-17 ENCOUNTER — Telehealth: Payer: Self-pay | Admitting: *Deleted

## 2017-11-17 NOTE — Telephone Encounter (Signed)
"  I originally had surgery scheduled for October 11.  I am calling to see if I can actually change that to a later date in October or November.  It's because I have training during that week.  I was wondering if you could call me back."  I attempted to call her back.  I left her a message that I was going to reschedule her surgery from 02/12/18 to 03/05/18.  I asked her to call if this date is not good for her.  I called Aram BeechamCynthia at the surgical center and rescheduled the surgery.

## 2018-01-08 ENCOUNTER — Telehealth: Payer: Self-pay | Admitting: *Deleted

## 2018-01-08 NOTE — Telephone Encounter (Signed)
Patient rescheduled to 04/20/2018.  (Call to confirm)

## 2018-01-08 NOTE — Telephone Encounter (Signed)
"  I want to reschedule my surgery."

## 2018-01-19 NOTE — Telephone Encounter (Signed)
I attempted to call the patient to see if December 20 was the correct date for her surgery.  She returned my call and stated that was the correct date.  I apologized for my confusion.

## 2018-02-17 ENCOUNTER — Telehealth: Payer: Self-pay | Admitting: *Deleted

## 2018-02-17 NOTE — Telephone Encounter (Signed)
"  I'm calling to confirm that my surgery is still scheduled for December 20."  Yes, it is scheduled for December 20.  "Good, that's all I wanted to know."

## 2018-02-18 ENCOUNTER — Other Ambulatory Visit: Payer: Federal, State, Local not specified - PPO

## 2018-02-19 ENCOUNTER — Ambulatory Visit: Payer: Federal, State, Local not specified - PPO | Admitting: Podiatry

## 2018-02-19 ENCOUNTER — Ambulatory Visit (INDEPENDENT_AMBULATORY_CARE_PROVIDER_SITE_OTHER): Payer: Federal, State, Local not specified - PPO

## 2018-02-19 DIAGNOSIS — M79671 Pain in right foot: Secondary | ICD-10-CM

## 2018-02-19 DIAGNOSIS — S96911A Strain of unspecified muscle and tendon at ankle and foot level, right foot, initial encounter: Secondary | ICD-10-CM

## 2018-02-24 ENCOUNTER — Encounter: Payer: Self-pay | Admitting: Podiatry

## 2018-02-24 NOTE — Progress Notes (Signed)
Subjective: Ann Mitchell presents today with cc of painful right foot. She states the pain started when she went running. She felt pain on the top of her foot. She has been icing and ibuprofen  and it has not helped.  She states she wears workboots at the [place]. 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  She is to have surgery with Dr. Milinda Pointer soon on a nodule located on the medial aspect of her navicular.  Objective: Vascular Examination: Capillary refill time immediate x 10 digits Dorsalis pedis and posterior tibial pulses present b/l + digital hair x 10 digits Skin temperature warm to warm b/l  Dermatological Examination: Skin with normal turgor, texture and tone Toenails adequate length b/l, nondystrophic Palpable nodule noted medial aspect navicular  Musculoskeletal: Muscle strength 5/5 to all LE muscle groups Pain on palpation dorsomedial aspect right foot. No ecchymosis, no edema, no erythema.  Neurological: Sensation intact with 10 gram monofilament. Vibratory sensation intact.  Xray right foot: Soft tissue mass noted medial aspect navicular Decreased joint space noted 1st met-cuneiform joint Os perineum noted plantarlateral cuboid No evidence of fracture  Assessment: Right foot strain  Plan: 1. Examined patient 2. Reviewed xrays right foot 3. Ms. Schwenn instructed to postpone physical activity and rest her right foot. 4. Dispensed TriLock Ankle Brace 5. She is to ice right foot 15-20 minutes/day 6. Continue ibuprofen 7. Follow up 1 week.

## 2018-03-03 ENCOUNTER — Ambulatory Visit: Payer: Federal, State, Local not specified - PPO | Admitting: Podiatry

## 2018-04-08 ENCOUNTER — Telehealth: Payer: Self-pay | Admitting: *Deleted

## 2018-04-08 NOTE — Telephone Encounter (Signed)
"  I have my surgery scheduled for the 20th of this month.  I'm calling to confirm if it's still going on and what time it will be.  Please give me a call."   I left her a message that we do have her scheduled for December 20.  I also told her that someone from the facility will call her a day or two prior to the surgery date and give her the arrival time.

## 2018-04-21 ENCOUNTER — Other Ambulatory Visit: Payer: Self-pay | Admitting: Podiatry

## 2018-04-21 MED ORDER — ONDANSETRON HCL 4 MG PO TABS: 4.0000 mg | ORAL_TABLET | Freq: Three times a day (TID) | ORAL | 0 refills | Status: AC | PRN

## 2018-04-21 MED ORDER — OXYCODONE-ACETAMINOPHEN 10-325 MG PO TABS: 1.0000 | ORAL_TABLET | Freq: Four times a day (QID) | ORAL | 0 refills | Status: AC | PRN

## 2018-04-21 MED ORDER — CLINDAMYCIN HCL 150 MG PO CAPS: 150.0000 mg | ORAL_CAPSULE | Freq: Three times a day (TID) | ORAL | 0 refills | Status: DC

## 2018-04-23 ENCOUNTER — Encounter: Payer: Self-pay | Admitting: Podiatry

## 2018-04-23 DIAGNOSIS — M67471 Ganglion, right ankle and foot: Secondary | ICD-10-CM | POA: Diagnosis not present

## 2018-04-29 ENCOUNTER — Ambulatory Visit (INDEPENDENT_AMBULATORY_CARE_PROVIDER_SITE_OTHER): Payer: Federal, State, Local not specified - PPO | Admitting: Podiatry

## 2018-04-29 DIAGNOSIS — M6740 Ganglion, unspecified site: Secondary | ICD-10-CM

## 2018-04-29 NOTE — Progress Notes (Signed)
This patient presents the office 1 week after removal of a soft tissue mass on the inside border right foot she presents the office today with a bandage that has mildly bled at the surgical site she says she is not having much pain discomfort and has been keeping her bandage dry.  She says she has done minimal walking and has been elevating her foot as recommended.  She presents the office today for an evaluation and treatment.  Objective good wound coaptation with sutures intact.  No signs of redness swelling or infection.  No palpable pain noted at the surgical site.  The sutures are intact and the medial aspect of the navicular of the right foot.  Postoperative visit #1.  ROV.  Bandage was removed on the right foot and the surgical site was examined.  No evidence of any pathology noted at this time.  Betadine dry sterile dressing was reapplied to her right foot.  She was then told to continue to ambulate with her surgical shoe.  Also recommended that she continue to elevate her foot as recommended.     Return to clinic in 1 week to be seen by Dr. Al CorpusHyatt per gzermany.   Helane GuntherGregory Cyler Kappes DPM

## 2018-04-30 ENCOUNTER — Encounter: Payer: Self-pay | Admitting: Podiatry

## 2018-05-11 ENCOUNTER — Ambulatory Visit (INDEPENDENT_AMBULATORY_CARE_PROVIDER_SITE_OTHER): Payer: Federal, State, Local not specified - PPO | Admitting: Podiatry

## 2018-05-11 DIAGNOSIS — Z9889 Other specified postprocedural states: Secondary | ICD-10-CM

## 2018-05-11 DIAGNOSIS — M6740 Ganglion, unspecified site: Secondary | ICD-10-CM | POA: Diagnosis not present

## 2018-05-11 NOTE — Progress Notes (Signed)
She presents today date of surgery 04/23/2018 status post excision ganglion medial aspect of the right foot states that is doing well and there is no pain.  She denies fever chills nausea vomiting muscle aches pains calf pain back pain chest pain shortness of breath.  Objective: Vital signs are stable she is alert and oriented x3 there is no erythema just mild edema no cellulitis drainage or odor sutures removed today margins remain well coapted.  Assessment: Well-healing surgical foot.  Plan: Place her in a compression anklet and allow her to get back into her regular shoe would like to follow-up with her in a couple 4 weeks.

## 2018-05-25 ENCOUNTER — Encounter: Payer: Self-pay | Admitting: Podiatry

## 2018-05-25 ENCOUNTER — Ambulatory Visit (INDEPENDENT_AMBULATORY_CARE_PROVIDER_SITE_OTHER): Payer: Self-pay | Admitting: Podiatry

## 2018-05-25 DIAGNOSIS — M6740 Ganglion, unspecified site: Secondary | ICD-10-CM

## 2018-05-25 DIAGNOSIS — Z9889 Other specified postprocedural states: Secondary | ICD-10-CM

## 2018-05-26 NOTE — Progress Notes (Signed)
She presents today date of surgery 04/23/2018 1 month status post excision ganglion cyst medial aspect of the medial longitudinal arch near the navicular tuberosity.  She states that it feels fine there is no pain whatsoever.  She states that is dry a little bit but all in all she feels great.  Objective: There is no erythema edema cellulitis drainage or odor she has some dry skin over the incision site which is gone on to heal uneventfully.  On palpation there is no recurrence of any cyst.  Assessment: Well-healing surgical foot right.  Plan: Allow her to get back to any regular routine that she wants to if it is painful she will increase it slowly over time and I encouraged her to continue application of lotions and oils for the dry skin.

## 2018-06-08 ENCOUNTER — Other Ambulatory Visit: Payer: Federal, State, Local not specified - PPO

## 2018-07-20 ENCOUNTER — Ambulatory Visit (INDEPENDENT_AMBULATORY_CARE_PROVIDER_SITE_OTHER): Payer: BLUE CROSS/BLUE SHIELD | Admitting: Podiatry

## 2018-07-20 ENCOUNTER — Encounter: Payer: Self-pay | Admitting: Podiatry

## 2018-07-20 ENCOUNTER — Other Ambulatory Visit: Payer: Self-pay

## 2018-07-20 DIAGNOSIS — M76811 Anterior tibial syndrome, right leg: Secondary | ICD-10-CM | POA: Diagnosis not present

## 2018-07-20 DIAGNOSIS — Z9889 Other specified postprocedural states: Secondary | ICD-10-CM

## 2018-07-20 DIAGNOSIS — M6740 Ganglion, unspecified site: Secondary | ICD-10-CM

## 2018-07-20 MED ORDER — METHYLPREDNISOLONE 4 MG PO TBPK: ORAL_TABLET | ORAL | 0 refills | Status: DC

## 2018-07-20 NOTE — Progress Notes (Signed)
She presents today for postop visit regarding excision of a ganglion tumor of the right foot states that is doing just great date of surgery was 04/23/2018 however I am hurting around it right up and here as she points to the insertion site of the tibialis anterior tendon.  Objective: Vital signs are stable alert oriented x3 surgical site is gone on to heal uneventfully.  Slight area of callus over the incision site more than likely associated with her orthotics.  She does have pain on the insertion site of the tibialis anterior with fluctuance on palpation and on inversion with dorsiflexion.  Assessment: Tibialis anterior tendinitis well-healing surgical foot.  Plan: At this point inject the insertion site of the tibialis anterior with 2 mg of dexamethasone local anesthetic put her in a cam walker and wrote a prescription for Medrol Dosepak.

## 2018-08-24 ENCOUNTER — Ambulatory Visit: Payer: Federal, State, Local not specified - PPO | Admitting: Podiatry

## 2018-08-24 ENCOUNTER — Encounter: Payer: Self-pay | Admitting: Podiatry

## 2018-08-24 ENCOUNTER — Other Ambulatory Visit: Payer: Self-pay

## 2018-08-24 VITALS — Temp 97.2°F

## 2018-08-24 DIAGNOSIS — M76811 Anterior tibial syndrome, right leg: Secondary | ICD-10-CM | POA: Diagnosis not present

## 2018-08-25 ENCOUNTER — Encounter: Payer: Self-pay | Admitting: Podiatry

## 2018-08-25 NOTE — Progress Notes (Signed)
She presents today for follow-up date of surgery is 04/23/2018 right as we refer her to an excision soft tissue mass.  She is also here to follow-up for insertional tibialis anterior tendinitis.  She states that she is doing fine no pain.  Objective: Vital signs are stable she is alert and oriented x3 pulses are palpable.  There is no erythema edema cellulitis drainage or odor she has no pain on palpation of the surgical site.  Assessment: Well-healing surgical foot.  Plan: Follow-up with me as needed.

## 2019-05-07 ENCOUNTER — Inpatient Hospital Stay
Admit: 2019-05-07 | Discharge: 2019-05-08 | Disposition: A | Discharge status: Other Acute Inpatient Hospital | Payer: BLUE CROSS/BLUE SHIELD | Attending: Emergency Medicine

## 2019-05-07 DIAGNOSIS — R45851 Suicidal ideations: Secondary | ICD-10-CM

## 2019-05-07 LAB — URINALYSIS W/ RFLX MICROSCOPIC
Bilirubin, Urine: NEGATIVE
Bilirubin: NEGATIVE
Blood, Urine: NEGATIVE
Blood: NEGATIVE
Glucose, Ur: NEGATIVE mg/dl
Glucose: NEGATIVE mg/dl
Ketone: NEGATIVE mg/dl
Ketones, Urine: NEGATIVE mg/dl
Leukocyte Esterase, Urine: NEGATIVE
Leukocyte Esterase: NEGATIVE
Nitrite, Urine: NEGATIVE
Nitrites: NEGATIVE
Protein, UA: NEGATIVE mg/dl
Protein: NEGATIVE mg/dl
Specific Gravity, UA: 1.025 (ref 1.005–1.030)
Specific gravity: 1.025 (ref 1.005–1.030)
Urobilinogen, UA, POCT: 0.2 mg/dl (ref 0.0–1.0)
Urobilinogen: 0.2 mg/dl (ref 0.0–1.0)
pH (UA): 6.5 (ref 5.0–9.0)
pH, UA: 6.5 (ref 5.0–9.0)

## 2019-05-07 LAB — COMPREHENSIVE METABOLIC PANEL
ALT: 25 U/L (ref 12–78)
AST: 30 U/L (ref 15–37)
Albumin: 3.8 gm/dl (ref 3.4–5.0)
Alkaline Phosphatase: 120 U/L — ABNORMAL HIGH (ref 45–117)
Anion Gap: 7 mmol/L (ref 5–15)
BUN: 8 mg/dl (ref 7–25)
CO2: 25 mEq/L (ref 21–32)
Calcium: 9.6 mg/dl (ref 8.5–10.1)
Chloride: 104 mEq/L (ref 98–107)
Creatinine: 0.9 mg/dl (ref 0.6–1.3)
EGFR IF NonAfrican American: >60
GFR African American: >60
Glucose: 79 mg/dl (ref 74–106)
Potassium: 4.5 mEq/L (ref 3.5–5.1)
Sodium: 135 mEq/L — ABNORMAL LOW (ref 136–145)
Total Bilirubin: 0.5 mg/dl (ref 0.2–1.0)
Total Protein: 8.6 gm/dl — ABNORMAL HIGH (ref 6.4–8.2)

## 2019-05-07 LAB — CBC WITH AUTO DIFFERENTIAL
Basophils %: 0.5 % (ref 0–3)
Eosinophils %: 1.4 % (ref 0–5)
Hematocrit: 37.8 % (ref 37.0–50.0)
Hemoglobin: 13.4 gm/dl (ref 13.0–17.2)
Immature Granulocytes: 0.4 % (ref 0.0–3.0)
Lymphocytes %: 31.4 % (ref 28–48)
MCH: 29.9 pg (ref 25.4–34.6)
MCHC: 35.4 gm/dl (ref 30.0–36.0)
MCV: 84.4 fL (ref 80.0–98.0)
MPV: 10.1 fL — ABNORMAL HIGH (ref 6.0–10.0)
Monocytes %: 8.8 % (ref 1–13)
Neutrophils %: 57.5 % (ref 34–64)
Nucleated RBCs: 0 (ref 0–0)
Platelets: 264 10*3/uL (ref 140–450)
RBC: 4.48 M/uL (ref 3.60–5.20)
RDW-SD: 42.3 (ref 36.4–46.3)
WBC: 7.9 10*3/uL (ref 4.0–11.0)

## 2019-05-07 LAB — POC HCG,URINE
HCG urine, QL: NEGATIVE
Pregnancy Test(Urn): NEGATIVE

## 2019-05-07 LAB — DRUG SCREEN, URINE
Amphetamine: NEGATIVE
Amphetamines: NEGATIVE
Barbiturates: NEGATIVE
Barbiturates: NEGATIVE
Benzodiazapines: NEGATIVE
Benzodiazepines: NEGATIVE
Cocaine: NEGATIVE
Cocaine: NEGATIVE
Marijuana: NEGATIVE
Marijuana: NEGATIVE
Methadone: NEGATIVE
Methadone: NEGATIVE
Opiates: NEGATIVE
Opiates: NEGATIVE
Phencyclidine: NEGATIVE
Phencyclidine: NEGATIVE

## 2019-05-07 LAB — SALICYLATE
Salicyclic Acid: <1.7 mg/dl — ABNORMAL LOW (ref 2.8–20.0)
Salicylate: <1.7 mg/dl — ABNORMAL LOW (ref 2.8–20.0)

## 2019-05-07 LAB — COVID-19: COVID-19: NEGATIVE

## 2019-05-07 LAB — ETHYL ALCOHOL
ALCOHOL(ETHYL),SERUM: <3 mg/dl (ref 0.0–9.0)
Ethyl Alcohol: <3 mg/dl (ref 0.0–9.0)

## 2019-05-07 LAB — ACETAMINOPHEN LEVEL: Acetaminophen Level: <2 ug/mL — ABNORMAL LOW (ref 10.0–30.0)

## 2019-05-07 LAB — CBC WITH AUTOMATED DIFF
BASOPHILS: 0.5 % (ref 0–3)
EOSINOPHILS: 1.4 % (ref 0–5)
HCT: 37.8 % (ref 37.0–50.0)
HGB: 13.4 gm/dl (ref 13.0–17.2)
IMMATURE GRANULOCYTES: 0.4 % (ref 0.0–3.0)
LYMPHOCYTES: 31.4 % (ref 28–48)
MCH: 29.9 pg (ref 25.4–34.6)
MCHC: 35.4 gm/dl (ref 30.0–36.0)
MCV: 84.4 fL (ref 80.0–98.0)
MONOCYTES: 8.8 % (ref 1–13)
MPV: 10.1 fL — ABNORMAL HIGH (ref 6.0–10.0)
NEUTROPHILS: 57.5 % (ref 34–64)
NRBC: 0 (ref 0–0)
PLATELET: 264 10*3/uL (ref 140–450)
RBC: 4.48 M/uL (ref 3.60–5.20)
RDW-SD: 42.3 (ref 36.4–46.3)
WBC: 7.9 10*3/uL (ref 4.0–11.0)

## 2019-05-07 LAB — METABOLIC PANEL, COMPREHENSIVE
ALT (SGPT): 25 U/L (ref 12–78)
AST (SGOT): 30 U/L (ref 15–37)
Albumin: 3.8 gm/dl (ref 3.4–5.0)
Alk. phosphatase: 120 U/L — ABNORMAL HIGH (ref 45–117)
Anion gap: 7 mmol/L (ref 5–15)
BUN: 8 mg/dl (ref 7–25)
Bilirubin, total: 0.5 mg/dl (ref 0.2–1.0)
CO2: 25 mEq/L (ref 21–32)
Calcium: 9.6 mg/dl (ref 8.5–10.1)
Chloride: 104 mEq/L (ref 98–107)
Creatinine: 0.9 mg/dl (ref 0.6–1.3)
GFR est AA: >60
GFR est non-AA: >60
Glucose: 79 mg/dl (ref 74–106)
Potassium: 4.5 mEq/L (ref 3.5–5.1)
Protein, total: 8.6 gm/dl — ABNORMAL HIGH (ref 6.4–8.2)
Sodium: 135 mEq/L — ABNORMAL LOW (ref 136–145)

## 2019-05-07 LAB — ACETAMINOPHEN: Acetaminophen level: <2 ug/mL — ABNORMAL LOW (ref 10.0–30.0)

## 2019-05-07 LAB — COVID-19 (INPATIENT TESTING): COVID-19: NEGATIVE

## 2019-05-07 NOTE — ED Notes (Signed)
Water provided to patient.

## 2019-05-07 NOTE — ED Notes (Signed)
Patient's mom educated about policy of not having visitors d/t suicide precautions.  Patient's mom proceeded to take photo of whiteboard in patient's room.  Rain, ED Tech and Primary RN at bedside, getting labs.  Patient arrived to treatment room.  Requested urine sample--requested patient produce specimen before changing into gown if possible  Explained importance of collecting all specimens in a timely fashion--as well as getting tests performed --further explained what patient can reasonably expect as possibilities for treatment.   Patient cannot urinate at this time.

## 2019-05-07 NOTE — ED Notes (Signed)
Patient moved from ER20 to ER room 17.

## 2019-05-07 NOTE — ED Notes (Signed)
Patient had overnight bag; patient's mother took overnight bag home and left some of patient's belongings.   Security assisted with property and made a new belonging sheet.   Belongings in [place] #17  Patient is on the phone at this moment with patient's mom  Patient wanted to leave, admitted that she was still suicidal but was requesting to leave.  Behavioral health was made aware.

## 2019-05-07 NOTE — ED Provider Notes (Signed)
[facility]  Emergency Department Treatment Report    Patient: Mandy Chen Age: 29 y.o. Sex: female    Date of Birth: Jun 11, 1990 Admit Date: 05/07/2019 PCP: Tora Kindred, MD   MRN: [id number]  CSN: [id number]     Room: ER17/ER17 Time Dictated: 1:16 PM       Chief Complaint   *Suicidal thoughts  History of Present Illness   29 y.o. female *presents feeling suicidal and "I want to be admitted to the pysch hospital." she has been feeling like this mid/late November and worse over 1 week. Nothing specific happened. She is under a lot of stress.  This has happened before but never admitted  She has thoughts that she would be better off dead.  She says "If I do anything it will be very quick". She also thinks that someone is after her that wants to hurt her but it is not specifically anyone. She also will dream that she is fearful of someone coming into her room and isn't sleep ing well  She has no thoughts to hurt anyone else  She denies doing anything to hurt herself    She has a psychiatrist and a therapist    She is here with her mother and wishes her to stay in room while I takl to her      Later on crisis clinician evaluation patient did endorse a plan of jumping into traffic  Review of Systems   Review of Systems   Constitutional: Negative for fever.   HENT: Negative for congestion and sore throat.         No loss of taste or smell   Eyes: Negative for redness.   Respiratory: Negative for cough and shortness of breath.    Cardiovascular: Negative for chest pain.   Gastrointestinal: Negative for abdominal pain, diarrhea and vomiting.   Genitourinary: Negative for dysuria.   Musculoskeletal: Negative for falls.   Skin: Negative for rash.   Neurological: Negative for sensory change, focal weakness, loss of consciousness and headaches.   Psychiatric/Behavioral: Positive for suicidal ideas.       Past Medical/Surgical History   History reviewed. No pertinent past medical history.  Past Surgical  History:   Procedure Laterality Date   ??? HX HEENT      wisdom teeth removal   ??? 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 Marital status: SINGLE     Spouse name: Not on file   ??? Number of children: Not on file   ??? Years of education: Not on file   ??? Highest education level: Not on file   Tobacco Use   ??? Smoking status: Never Smoker   ??? Smokeless tobacco: Never Used   Substance and Sexual Activity   ??? Alcohol use: No   ??? Drug use: No       Family History   History reviewed. No pertinent family history.    Current Medications      Reviewed with patient  Prior to Admission Medications   Prescriptions Last Dose Informant Patient Reported? Taking?   DICLOFENAC SODIUM PO   Yes Yes   Sig: Take 75 mg by mouth.   FLUoxetine (PROzac) 40 mg capsule   Yes Yes   Sig: Take 40  mg by mouth daily.   OTHER   Yes Yes   Sig: Indications: Olly sleep gummies   hydrOXYzine HCL (ATARAX) 25 mg tablet   Yes Yes   Sig: Take 25 mg by mouth every six (6) hours as needed.   omeprazole (PRILOSEC) 40 mg capsule   Yes Yes   Sig: Take 40 mg by mouth daily.   venlafaxine (EFFEXOR) 37.5 mg tablet   Yes Yes   Sig: Take 37.5 mg by mouth daily.      Facility-Administered Medications: None     Allergies   No Known Allergies  Physical Exam     ED Triage Vitals   Enc Vitals Group      BP 05/07/19 1215 137/83      Pulse (Heart Rate) 05/07/19 1215 92      Resp Rate 05/07/19 1215 16      Temp 05/07/19 1215 98.8 ??F (37.1 ??C)      Temp src --       O2 Sat (%) 05/07/19 1215 100 %      Weight 05/07/19 1207 177 lb      Height 05/07/19 1207 '4\' 10"'       Head Circumference --       Peak Flow --       Pain Score --       Pain Loc --       Pain Edu? --       Excl. in [place]? --        Physical Exam  HENT:      Head: Normocephalic.   Eyes:      Conjunctiva/sclera: Conjunctivae normal.   Neck:      Musculoskeletal: Normal range  of motion.   Cardiovascular:      Rate and Rhythm: Normal rate and regular rhythm.   Pulmonary:      Effort: Pulmonary effort is normal.      Breath sounds: Normal breath sounds.   Abdominal:      Palpations: Abdomen is soft.      Tenderness: There is no abdominal tenderness.   Musculoskeletal: Normal range of motion.   Skin:     General: Skin is warm and dry.   Neurological:      Mental Status: She is alert.      Comments: Awake alert appropriately oriented moving extremities without difficulty, no focal deficits   Psychiatric:      Comments: Calm and cooperative       *  Impression and Management Plan   Patient presents with concern of suicidal thoughts.  She denies any attempt to hurt her self.  She is appropriately oriented with a benign exam and stable vitals.  I have ordered medical clearance labs and the crisis clinician consult.    Patient was seen by the crisis clinician Raneisha who advised the patient would be voluntary for inpatient and asked me to order the Covid screening test    Diagnostic Studies   Lab:   Recent Results (from the past 12 hour(s))   CBC WITH AUTOMATED DIFF    Collection Time: 05/07/19  1:36 PM   Result Value Ref Range    WBC 7.9 4.0 - 11.0 1000/mm3    RBC 4.48 3.60 - 5.20 M/uL    HGB 13.4 13.0 - 17.2 gm/dl    HCT 37.8 37.0 - 50.0 %    MCV 84.4 80.0 - 98.0 fL    MCH 29.9 25.4 - 34.6 pg    MCHC  35.4 30.0 - 36.0 gm/dl    PLATELET 264 140 - 450 1000/mm3    MPV 10.1 (H) 6.0 - 10.0 fL    RDW-SD 42.3 36.4 - 46.3      NRBC 0 0 - 0      IMMATURE GRANULOCYTES 0.4 0.0 - 3.0 %    NEUTROPHILS 57.5 34 - 64 %    LYMPHOCYTES 31.4 28 - 48 %    MONOCYTES 8.8 1 - 13 %    EOSINOPHILS 1.4 0 - 5 %    BASOPHILS 0.5 0 - 3 %   METABOLIC PANEL, COMPREHENSIVE    Collection Time: 05/07/19  1:36 PM   Result Value Ref Range    Sodium 135 (L) 136 - 145 mEq/L    Potassium 4.5 3.5 - 5.1 mEq/L    Chloride 104 98 - 107 mEq/L    CO2 25 21 - 32 mEq/L    Glucose 79 74 - 106 mg/dl    BUN 8 7 - 25 mg/dl    Creatinine 0.9  0.6 - 1.3 mg/dl    GFR est AA >60.0      GFR est non-AA >60      Calcium 9.6 8.5 - 10.1 mg/dl    AST (SGOT) 30 15 - 37 U/L    ALT (SGPT) 25 12 - 78 U/L    Alk. phosphatase 120 (H) 45 - 117 U/L    Bilirubin, total 0.5 0.2 - 1.0 mg/dl    Protein, total 8.6 (H) 6.4 - 8.2 gm/dl    Albumin 3.8 3.4 - 5.0 gm/dl    Anion gap 7 5 - 15 mmol/L   ETHYL ALCOHOL    Collection Time: 05/07/19  1:36 PM   Result Value Ref Range    ALCOHOL(ETHYL),SERUM <3.0 0.0 - 9.0 mg/dl   DRUG SCREEN, URINE    Collection Time: 05/07/19  1:36 PM   Result Value Ref Range    Amphetamine NEGATIVE NEGATIVE      Barbiturates NEGATIVE NEGATIVE      Benzodiazepines NEGATIVE NEGATIVE      Cocaine NEGATIVE NEGATIVE      Marijuana NEGATIVE NEGATIVE      Methadone NEGATIVE NEGATIVE      Opiates NEGATIVE NEGATIVE      Phencyclidine NEGATIVE NEGATIVE     ACETAMINOPHEN    Collection Time: 05/07/19  1:36 PM   Result Value Ref Range    Acetaminophen level <2.0 (L) 10.0 - 10.6 mcg/ml   SALICYLATE    Collection Time: 05/07/19  1:36 PM   Result Value Ref Range    Salicylate <2.6 (L) 2.8 - 20.0 mg/dl   URINALYSIS W/ RFLX MICROSCOPIC    Collection Time: 05/07/19  1:36 PM   Result Value Ref Range    Color YELLOW YELLOW,STRAW      Appearance CLEAR CLEAR      Glucose NEGATIVE NEGATIVE,Negative mg/dl    Bilirubin NEGATIVE NEGATIVE,Negative      Ketone NEGATIVE NEGATIVE,Negative mg/dl    Specific gravity 1.025 1.005 - 1.030      Blood NEGATIVE NEGATIVE,Negative      pH (UA) 6.5 5.0 - 9.0      Protein NEGATIVE NEGATIVE,Negative mg/dl    Urobilinogen 0.2 0.0 - 1.0 mg/dl    Nitrites NEGATIVE NEGATIVE,Negative      Leukocyte Esterase NEGATIVE NEGATIVE,Negative     POC HCG,URINE    Collection Time: 05/07/19  2:06 PM   Result Value Ref Range    HCG urine, QL negative NEGATIVE,Negative,negative  COVID-19 (INPATIENT TESTING)    Collection Time: 05/07/19  3:04 PM    Specimen: NASOPHARYNGEAL SWAB   Result Value Ref Range    COVID-19 NEGATIVE NEGATIVE         Imaging:    No results  found.     ED Course/Medical Decision Making        Medications - No data to display  CBC with normal white count hemoglobin and platelets  CMP with an alk phos 120 otherwise unremarkable  Pregnancy test negative  Acetaminophen and salicylate negative alcohol negative    UDS negative  COVID negative    Patient has been medically cleared and seen by the crisis clinician.  She is recommending inpatient treatment and I have updated the patient on her results and she is in agreement with this.  If patient changes her mind and becomes involuntary then crisis clinician advised that we will have coverage through this evening but an ECO would need to be sought.    At change of shift Dr. Pamella Pert will assume care of patient        Final Diagnosis       ICD-10-CM ICD-9-CM   1. Suicidal ideation  R45.11 V62.84       Disposition   Transfer    Addendum by Dr. Owens Shark patient was accepted as a voluntary at [facility] hospital by Dr. Koleen Nimrod.  Will be transferring      Maylon Cos, MD  May 07, 2019      Dragon dictation software was used for portions of this report. Unintended errors in transcription may occur.    My signature above authenticates this document and my orders, the final ??  diagnosis (es), discharge prescription (s), and instructions in the Epic ??  record.  If you have any questions please contact [phone].  ??  Nursing notes have been reviewed by the physician/ advanced practice ??  Clinician.

## 2019-05-07 NOTE — ED Notes (Signed)
Patients mom, Shaterra Sanzone can be reached at [phone].

## 2019-05-07 NOTE — ED Notes (Signed)
Patient arrives to treatment room  Paper gown and anti-slip socks given  Personal property removed   Security assisted with property and belonging sheet.  Belongings in [place] # 20  "I feel suicidal, I am going through a hard time right now".  First step is Medical Clearance.   Patient verbalizes understanding with process.  Cooperating at this time.

## 2019-05-07 NOTE — Progress Notes (Signed)
BEHAVIORAL HEALTH UPDATE:  Bed search initiated    Pt continues to be voluntary. A petition has been completed by previous clinician and a copy placed in Pt's chart (bed 17), if Pt becomes involuntary or requests to leave. Petition should be faxed to magistrate [phone]) with follow-up call [phone]) to request ECO upon Pt attempting to leave, if needed.     Facilities have been advised of gap in behavioral health schedule and requested to call Charge RN for additional information/updates, as needed.     1:1 with Pt upon arrival at start of shift to clarify process and risk assessment completed by previous clinician. Pt continues to endorse suicidal ideations with increasing sx (x2wks) of hopelessness, withdrawal/isolation, lack of interest/pleasure in activities, difficulty maintaining hygiene routine, disrupted sleep patterns, racing thoughts, irritability, and fatigue. Writer provided supportive counseling, psychoeducation, and practiced visualization techniques to calm anxiety. Pt's affect was anxious as noted by her continuous movement of her hands and rapid speech; she was cooperative and engaged.      Pt's chart faxed/currently under review:  [facility] Leonette Most)  [facility] Derry Lory Yehuda Mao)      At capacity/No appropriate beds  [facility])  Gabrielle Dare Vikki Ports)  [phone]

## 2019-05-07 NOTE — ED Notes (Signed)
Patient medicated per MAR.

## 2019-05-07 NOTE — ED Notes (Signed)
Patient presents to ED with her mother due to suicidal ideation since November- thoughts progressing and getting worse per patient. Patient denies specific plan. Denies HI. Here on a voluntary basis.

## 2019-05-07 NOTE — ED Notes (Signed)
6:07 PM Turnover from Dr. Thana Ates.  Patient is voluntary.  Awaiting for psychiatric placement.    2:42 AM Patient continues voluntary but ECO petition paper was filled by behavioral clinician, Amber, if needed.  Patient will continue in ED observation for psychiatric placement.

## 2019-05-07 NOTE — Progress Notes (Signed)
 MENTAL HEALTH COMPLAINT  Pt brought in by her mother for SI.  Client reported that she has been experiencing increasing SI for the past 2 weeks.  Client reported that the main stressor was her  Grandmother passing away the day after Thanksgiving.  Client reported having intrusive thoughts about SI, feelings of hopelessness, helplessness and guilt.  Client reported having the SI but initially denied having a plan.  Client then admitted that if she was to do something it would be something quick when probed further, she stated that she would jump into traffic but showed concern about others being hurt in the process.      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  Pt doesn't have any children.  She isn't involved in a relationship at this time.    SA HX  Pt denied use of illicit drugs, tobacco products and etoh.  UDS pending at this time.    MH HX  Pt reported that she is under the care of psychiatrist, Dr. Almarie Guerin.  She reported that she does have an appointment for medication management next week.  She reported being prescribed Prozac, atarrax and effexor.  Pt reported that she has been dx with OCD and depression.  Pt reported that she is also under the care of a psychologist but couldn't remember her name.  Pt denied any previous mental health hospitalizations.    MSE  Pt was dressed in a paper hospital gown for safety.  She was alert and oriented x3.  She reported SI with plan to jump into traffic.  She denied HI and psychosis.  She reported some paranoia and believes that people are plotting against her, are out to get her and are following her.  She reported that she sometimes thinks people are trying to do something with her food.  She shared that she was concerned about a cup of water that was provided to her by the ED staff.  She did credit this behavior to her OCD dx.  Pt presents with fair insight and poor judgment.  She reported her mood as being  depressed and anxious at times.  Pt reported that she has been feeling irritated at times with a short temper.  Pt reported that she has loss interest in things she use to do like reading and writing.  She reported that completing her ADLs have been a chore.  She had good eye contact.  She reported issues with sleep and overeating at times.    CLINICAL IMPRESSION  Client presents with symptoms of depression aeb SI, intrusive thoughts, feelings of helplessness and hopelessness, inability to complete ADLs, loss of interest in former activities and feelings of sadness.  Client reported that she has been dealing with depression since 2013.  Client would benefit from inpt psychiatric hospitalization for mood stabilization, medication evaluation and safety.      PLAN  Pt is voluntary for inpt psych tx and is requesting admission to a psychiatric facility.  This writer will reach out to area facilities to present pt for admission.  Attending and RN staff notified.

## 2019-05-07 NOTE — ED Notes (Signed)
Report received from [place], [place]

## 2019-05-07 NOTE — ED Notes (Signed)
Patient's mom at bedside.  Patient on cellular device.  RN educated about cell phones are prohibited when on suicide watch.  Patient's mom in room, took cell phone.

## 2019-05-07 NOTE — ED Notes (Signed)
Patient moved from ER20 to ER room 17.

## 2019-05-07 NOTE — ED Provider Notes (Addendum)
[facility]  Emergency Department Treatment Report    Patient: Mandy Chen Age: 29 y.o. Sex: female    Date of Birth: [birth year] Admit Date: 05/07/2019 PCP: Tora Kindred, MD   MRN: [id number]  CSN: [id number]     Room: ER17/ER17 Time Dictated: 1:16 PM       Chief Complaint   *Suicidal thoughts  History of Present Illness   29 y.o. female *presents feeling suicidal and "I want to be admitted to the pysch hospital." she has been feeling like this mid/late November and worse over 1 week. Nothing specific happened. She is under a lot of stress.  This has happened before but never admitted  She has thoughts that she would be better off dead.  She says "If I do anything it will be very quick". She also thinks that someone is after her that wants to hurt her but it is not specifically anyone. She also will dream that she is fearful of someone coming into her room and isn't sleep ing well  She has no thoughts to hurt anyone else  She denies doing anything to hurt herself    She has a psychiatrist and a therapist    She is here with her mother and wishes her to stay in room while I takl to her      Later on crisis clinician evaluation patient did endorse a plan of jumping into traffic  Review of Systems   Review of Systems   Constitutional: Negative for fever.   HENT: Negative for congestion and sore throat.         No loss of taste or smell   Eyes: Negative for redness.   Respiratory: Negative for cough and shortness of breath.    Cardiovascular: Negative for chest pain.   Gastrointestinal: Negative for abdominal pain, diarrhea and vomiting.   Genitourinary: Negative for dysuria.   Musculoskeletal: Negative for falls.   Skin: Negative for rash.   Neurological: Negative for sensory change, focal weakness, loss of consciousness and headaches.   Psychiatric/Behavioral: Positive for suicidal ideas.       Past Medical/Surgical History   History reviewed. No pertinent past medical history.   Past Surgical History:   Procedure Laterality Date   ??? HX HEENT      wisdom teeth removal   ??? 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 Marital status: SINGLE     Spouse name: Not on file   ??? Number of children: Not on file   ??? Years of education: Not on file   ??? Highest education level: Not on file   Tobacco Use   ??? Smoking status: Never Smoker   ??? Smokeless tobacco: Never Used   Substance and Sexual Activity   ??? Alcohol use: No   ??? Drug use: No       Family History   History reviewed. No pertinent family history.    Current Medications      Reviewed with patient  Prior to Admission Medications   Prescriptions Last Dose Informant Patient Reported? Taking?   DICLOFENAC SODIUM PO   Yes Yes   Sig: Take 75 mg by mouth.   FLUoxetine (PROzac) 40 mg capsule   Yes Yes   Sig: Take 40  mg by mouth daily.   OTHER   Yes Yes   Sig: Indications: Olly sleep gummies   hydrOXYzine HCL (ATARAX) 25 mg tablet   Yes Yes   Sig: Take 25 mg by mouth every six (6) hours as needed.   omeprazole (PRILOSEC) 40 mg capsule   Yes Yes   Sig: Take 40 mg by mouth daily.   venlafaxine (EFFEXOR) 37.5 mg tablet   Yes Yes   Sig: Take 37.5 mg by mouth daily.      Facility-Administered Medications: None     Allergies   No Known Allergies  Physical Exam     ED Triage Vitals   Enc Vitals Group      BP 05/07/19 1215 137/83      Pulse (Heart Rate) 05/07/19 1215 92      Resp Rate 05/07/19 1215 16      Temp 05/07/19 1215 98.8 ??F (37.1 ??C)      Temp src --       O2 Sat (%) 05/07/19 1215 100 %      Weight 05/07/19 1207 177 lb      Height 05/07/19 1207 4' 10"      Head Circumference --       Peak Flow --       Pain Score --       Pain Loc --       Pain Edu? --       Excl. in [place]? --        Physical Exam  HENT:      Head: Normocephalic.   Eyes:      Conjunctiva/sclera: Conjunctivae normal.   Neck:       Musculoskeletal: Normal range of motion.   Cardiovascular:      Rate and Rhythm: Normal rate and regular rhythm.   Pulmonary:      Effort: Pulmonary effort is normal.      Breath sounds: Normal breath sounds.   Abdominal:      Palpations: Abdomen is soft.      Tenderness: There is no abdominal tenderness.   Musculoskeletal: Normal range of motion.   Skin:     General: Skin is warm and dry.   Neurological:      Mental Status: She is alert.      Comments: Awake alert appropriately oriented moving extremities without difficulty, no focal deficits   Psychiatric:      Comments: Calm and cooperative       *  Impression and Management Plan   Patient presents with concern of suicidal thoughts.  She denies any attempt to hurt her self.  She is appropriately oriented with a benign exam and stable vitals.  I have ordered medical clearance labs and the crisis clinician consult.    Patient was seen by the crisis clinician Raneisha who advised the patient would be voluntary for inpatient and asked me to order the Covid screening test    Diagnostic Studies   Lab:   Recent Results (from the past 12 hour(s))   CBC WITH AUTOMATED DIFF    Collection Time: 05/07/19  1:36 PM   Result Value Ref Range    WBC 7.9 4.0 - 11.0 1000/mm3    RBC 4.48 3.60 - 5.20 M/uL    HGB 13.4 13.0 - 17.2 gm/dl    HCT 37.8 37.0 - 50.0 %    MCV 84.4 80.0 - 98.0 fL    MCH 29.9 25.4 - 34.6 pg    MCHC  35.4 30.0 - 36.0 gm/dl    PLATELET 264 140 - 450 1000/mm3    MPV 10.1 (H) 6.0 - 10.0 fL    RDW-SD 42.3 36.4 - 46.3      NRBC 0 0 - 0      IMMATURE GRANULOCYTES 0.4 0.0 - 3.0 %    NEUTROPHILS 57.5 34 - 64 %    LYMPHOCYTES 31.4 28 - 48 %    MONOCYTES 8.8 1 - 13 %    EOSINOPHILS 1.4 0 - 5 %    BASOPHILS 0.5 0 - 3 %   METABOLIC PANEL, COMPREHENSIVE    Collection Time: 05/07/19  1:36 PM   Result Value Ref Range    Sodium 135 (L) 136 - 145 mEq/L    Potassium 4.5 3.5 - 5.1 mEq/L    Chloride 104 98 - 107 mEq/L    CO2 25 21 - 32 mEq/L    Glucose 79 74 - 106 mg/dl     BUN 8 7 - 25 mg/dl    Creatinine 0.9 0.6 - 1.3 mg/dl    GFR est AA >60.0      GFR est non-AA >60      Calcium 9.6 8.5 - 10.1 mg/dl    AST (SGOT) 30 15 - 37 U/L    ALT (SGPT) 25 12 - 78 U/L    Alk. phosphatase 120 (H) 45 - 117 U/L    Bilirubin, total 0.5 0.2 - 1.0 mg/dl    Protein, total 8.6 (H) 6.4 - 8.2 gm/dl    Albumin 3.8 3.4 - 5.0 gm/dl    Anion gap 7 5 - 15 mmol/L   ETHYL ALCOHOL    Collection Time: 05/07/19  1:36 PM   Result Value Ref Range    ALCOHOL(ETHYL),SERUM <3.0 0.0 - 9.0 mg/dl   DRUG SCREEN, URINE    Collection Time: 05/07/19  1:36 PM   Result Value Ref Range    Amphetamine NEGATIVE NEGATIVE      Barbiturates NEGATIVE NEGATIVE      Benzodiazepines NEGATIVE NEGATIVE      Cocaine NEGATIVE NEGATIVE      Marijuana NEGATIVE NEGATIVE      Methadone NEGATIVE NEGATIVE      Opiates NEGATIVE NEGATIVE      Phencyclidine NEGATIVE NEGATIVE     ACETAMINOPHEN    Collection Time: 05/07/19  1:36 PM   Result Value Ref Range    Acetaminophen level <2.0 (L) 10.0 - 32.2 mcg/ml   SALICYLATE    Collection Time: 05/07/19  1:36 PM   Result Value Ref Range    Salicylate <0.2 (L) 2.8 - 20.0 mg/dl   URINALYSIS W/ RFLX MICROSCOPIC    Collection Time: 05/07/19  1:36 PM   Result Value Ref Range    Color YELLOW YELLOW,STRAW      Appearance CLEAR CLEAR      Glucose NEGATIVE NEGATIVE,Negative mg/dl    Bilirubin NEGATIVE NEGATIVE,Negative      Ketone NEGATIVE NEGATIVE,Negative mg/dl    Specific gravity 1.025 1.005 - 1.030      Blood NEGATIVE NEGATIVE,Negative      pH (UA) 6.5 5.0 - 9.0      Protein NEGATIVE NEGATIVE,Negative mg/dl    Urobilinogen 0.2 0.0 - 1.0 mg/dl    Nitrites NEGATIVE NEGATIVE,Negative      Leukocyte Esterase NEGATIVE NEGATIVE,Negative     POC HCG,URINE    Collection Time: 05/07/19  2:06 PM   Result Value Ref Range    HCG urine, QL negative NEGATIVE,Negative,negative  COVID-19 (INPATIENT TESTING)    Collection Time: 05/07/19  3:04 PM    Specimen: NASOPHARYNGEAL SWAB   Result Value Ref Range     COVID-19 NEGATIVE NEGATIVE         Imaging:    No results found.     ED Course/Medical Decision Making        Medications - No data to display  CBC with normal white count hemoglobin and platelets  CMP with an alk phos 120 otherwise unremarkable  Pregnancy test negative  Acetaminophen and salicylate negative alcohol negative    UDS negative  COVID negative    Patient has been medically cleared and seen by the crisis clinician.  She is recommending inpatient treatment and I have updated the patient on her results and she is in agreement with this.  If patient changes her mind and becomes involuntary then crisis clinician advised that we will have coverage through this evening but an ECO would need to be sought.    At change of shift Dr. Pamella Pert will assume care of patient        Final Diagnosis       ICD-10-CM ICD-9-CM   1. Suicidal ideation  R45.56 V62.84       Disposition   Transfer    Addendum by Dr. Owens Shark patient was accepted as a voluntary at [facility] hospital by Dr. Koleen Nimrod.  Will be transferring      Maylon Cos, MD  May 07, 2019      Dragon dictation software was used for portions of this report. Unintended errors in transcription may occur.    My signature above authenticates this document and my orders, the final ??  diagnosis (es), discharge prescription (s), and instructions in the Epic ??  record.  If you have any questions please contact [phone].  ??  Nursing notes have been reviewed by the physician/ advanced practice ??  Clinician.

## 2019-05-07 NOTE — ED Notes (Signed)
6:07 PM Turnover from Dr. McCormack.  Patient is voluntary.  Awaiting for psychiatric placement.    2:42 AM Patient continues voluntary but ECO petition paper was filled by behavioral clinician, Amber, if needed.  Patient will continue in ED observation for psychiatric placement.

## 2019-05-07 NOTE — Progress Notes (Signed)
BEHAVIORAL HEALTH UPDATE:  Bed search initiated    Pt continues to be voluntary. A petition has been completed by previous clinician and a copy placed in Pt's chart (bed 17), if Pt becomes involuntary or requests to leave. Petition should be faxed to magistrate [phone]) with follow-up call ([phone]) to request ECO upon Pt attempting to leave, if needed.     Facilities have been advised of gap in behavioral health schedule and requested to call Charge RN for additional information/updates, as needed.     1:1 with Pt upon arrival at start of shift to clarify process and risk assessment completed by previous clinician. Pt continues to endorse suicidal ideations with increasing sx (x2wks) of hopelessness, withdrawal/isolation, lack of interest/pleasure in activities, difficulty maintaining hygiene routine, disrupted sleep patterns, racing thoughts, irritability, and fatigue. Writer provided supportive counseling, psychoeducation, and practiced visualization techniques to calm anxiety. Pt's affect was anxious as noted by her continuous movement of her hands and rapid speech; she was cooperative and engaged.      Pt's chart faxed/currently under review:  [facility] ([facility])  [facility] ([place])      At capacity/No appropriate beds  [facility] ([place])  [facility] ([place]erie)  [phone]

## 2019-05-07 NOTE — ED Notes (Addendum)
Patient arrives to treatment room  Paper gown and anti-slip socks given  Personal property removed   Security assisted with property and belonging sheet.  Belongings in Locker # 20  "I feel suicidal, I am going through a hard time right now".  First step is Medical Clearance.   Patient verbalizes understanding with process.  Cooperating at this time.

## 2019-05-07 NOTE — Progress Notes (Signed)
MENTAL HEALTH COMPLAINT  Pt brought in by her mother for SI.  Client reported that she has been experiencing increasing SI for the past 2 weeks.  Client reported that the main stressor was her  Grandmother passing away the day after Thanksgiving.  Client reported having intrusive thoughts about SI, feelings of hopelessness, helplessness and guilt.  Client reported having the SI but initially denied having a plan.  Client then admitted that if she was to do something it would be "something quick" when probed further, she stated that she would jump into traffic but showed concern about others being hurt in the process.      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  Pt doesn't have any children.  She isn't involved in a relationship at this time.    SA HX  Pt denied use of illicit drugs, tobacco products and etoh.  UDS pending at this time.    MH HX  Pt reported that she is under the care of psychiatrist, Dr. Darcus Pester.  She reported that she does have an appointment for medication management next week.  She reported being prescribed Prozac, atarrax and effexor.  Pt reported that she has been dx with OCD and depression.  Pt reported that she is also under the care of a psychologist but couldn't remember her name.  Pt denied any previous mental health hospitalizations.     MSE  Pt was dressed in a paper hospital gown for safety.  She was alert and oriented x3.  She reported SI with plan to jump into traffic.  She denied HI and psychosis.  She reported some paranoia and believes that people are plotting against her, are out to get her and are following her.  She reported that she sometimes thinks people are trying to "do something" with her food.  She shared that she was concerned about a cup of water that was provided to her by the ED staff.  She did credit this behavior to her OCD dx.  Pt presents with fair insight and poor judgment.  She reported her mood as being depressed and anxious at times.  Pt reported that she has been feeling irritated at times with a short temper.  Pt reported that she has loss interest in things she use to do like reading and writing.  She reported that completing her ADLs have been a "chore."  She had good eye contact.  She reported issues with sleep and overeating at times.    CLINICAL IMPRESSION  Client presents with symptoms of depression aeb SI, intrusive thoughts, feelings of helplessness and hopelessness, inability to complete ADLs, loss of interest in former activities and feelings of sadness.  Client reported that she has been dealing with depression since 2013.  Client would benefit from inpt psychiatric hospitalization for mood stabilization, medication evaluation and safety.      PLAN  Pt is voluntary for inpt psych tx and is requesting admission to a psychiatric facility.  This writer will reach out to area facilities to present pt for admission.  Attending and RN staff notified.

## 2019-05-07 NOTE — ED Triage Notes (Signed)
Patient presents to ED with her mother due to suicidal ideation since November- thoughts progressing and getting worse per patient. Patient denies specific plan. Denies HI. Here on a voluntary basis.

## 2019-05-07 NOTE — ED Notes (Signed)
Patients mom, Marian Fallaw can be reached at [phone].

## 2019-05-07 NOTE — ED Notes (Signed)
Patient had overnight bag; patient's mother took overnight bag home and left some of patient's belongings.   Security assisted with property and made a new belonging sheet.   Belongings in Locker #17  Patient is on the phone at this moment with patient's mom  Patient wanted to leave, admitted that she was still suicidal but was requesting to leave.  Behavioral health was made aware.

## 2019-05-07 NOTE — ED Notes (Signed)
Patient's mom educated about policy of not having visitors d/t suicide precautions.  Patient's mom proceeded to take photo of whiteboard in patient's room.  Rain, ED Tech and Primary RN at bedside, getting labs.  Patient arrived to treatment room.  Requested urine sample--requested patient produce specimen before changing into gown if possible  Explained importance of collecting all specimens in a timely fashion--as well as getting tests performed --further explained what patient can reasonably expect as possibilities for treatment.   Patient cannot urinate at this time.

## 2019-05-07 NOTE — ED Notes (Signed)
Report received from Rebekah, RN

## 2019-05-07 NOTE — ED Notes (Signed)
Patient's mom at bedside.  Patient on cellular device.  RN educated about cell phones are prohibited when on suicide watch.  Patient's mom in room, took cell phone.

## 2019-05-08 MED ORDER — MELATONIN 3 MG TAB
3 mg | Freq: Every evening | ORAL | Status: DC
Start: 2019-05-08 — End: 2019-05-08
  Administered 2019-05-08: 03:00:00 via ORAL

## 2019-05-08 MED ORDER — LORAZEPAM 2 MG/ML IJ SOLN
2 mg/mL | Freq: Once | INTRAMUSCULAR | Status: DC
Start: 2019-05-08 — End: 2019-05-08

## 2019-05-08 MED ORDER — LORAZEPAM 1 MG TAB
1 mg | ORAL | Status: AC
Start: 2019-05-08 — End: 2019-05-08
  Administered 2019-05-08: 17:00:00 via ORAL

## 2019-05-08 MED FILL — LORAZEPAM 1 MG TAB: 1 mg | ORAL | Qty: 1

## 2019-05-08 MED FILL — MELATONIN 3 MG TAB: 3 mg | ORAL | Qty: 1

## 2019-05-08 NOTE — ED Notes (Signed)
Chart reviewed by Lance Muss, RN    06/20/19  11:54 AM

## 2019-05-08 NOTE — ED Notes (Signed)
Patient given lunch tray.    Patient is calm--awaiting placement--    Continues to be Voluntary at this time.

## 2019-05-08 NOTE — ED Notes (Signed)
Patient denies any SI/HI ideations at this time. Will continue to monitor.

## 2019-05-08 NOTE — ED Notes (Signed)
Beth from Crisis here obtaining consent form for patient to be admitted to [facility] [place] [facility].

## 2019-05-08 NOTE — ED Notes (Signed)
Medical Transport here     Transport crew looked through belongings,  Patient given clothes to put on.    Transport has the rest of her belongings.    Medical Transport has EMTALA and copy of the chart.      Patient ambulated out with transport.

## 2019-05-08 NOTE — ED Notes (Signed)
Bedside and Verbal shift change report given to Emmit Alexanders  (oncoming nurse) by Burman Freestone, RN  (offgoing nurse). Report included the following information SBAR .

## 2019-05-08 NOTE — Progress Notes (Signed)
 Mental Health Update: 1:1 with Pt earlier this morning to discuss plan of care.  Pt initially expressed desire for discharge due to concern of long wait time.  She had discussed discharge with her mother via phone for purpose of potentially having her mother transport Pt directly to a psychiatric facility.  At one point Pt stated she want to just go home.  Writer reviewed with Pt reason for her ED visit, outcome of evaluation and concerns for Pt's safety.  Pt requested writer speak with her mother.  Per Pt's request, writer contacted Pt's mother who expressed frustration regarding her daughter's extended stay in the ED and lack of communication. Writer provided update and answered any related questions which provided some reassurance.  Pt's mother requested to speak with her daughter to encourage her to remain voluntary status.    1:1 with Pt after her conversation with her mother.  Pt stated her willingness to remain voluntary for acute IP treatment and writer resumed the bed search.  [facility], [facility], [facility] and [facility] were at capacity.  Writer contacted [facility], faxed Pt's chart and provided collateral.  Pt was later accepted to [facility] under the care of Dr. Cordella Sauers.  ER nurse was provided with number for report.  Per Pt's request, Clinical research associate provided mother with update and all parties were in agreement with plan for transfer.

## 2019-05-08 NOTE — ED Notes (Signed)
Care assumed.      Patient up independently--gait steady    Ate breakfast.    Speaking with mother on phone in the hall at this time.

## 2019-05-08 NOTE — ED Notes (Signed)
Patient informed Officer to let nurse know she wants to leave.    Nurse let Officer know that TDO was already filled out by Crisis worker yesterday --    Crisis Worker Beth here--aware    Will evaluate and talk with patient this morning.

## 2019-05-08 NOTE — ED Notes (Signed)
Bedside and Verbal shift change report given to Lisa, H  (oncoming nurse) by Saleesha, RN  (offgoing nurse). Report included the following information SBAR .

## 2019-05-08 NOTE — ED Notes (Signed)
Patient denies any SI/HI ideations at this time. Will continue to monitor.

## 2019-05-08 NOTE — ED Notes (Signed)
Chart reviewed by Sadie Abbott, RN    06/20/19  11:54 AM

## 2019-05-08 NOTE — ED Notes (Signed)
Patient informed Officer to let nurse know she wants to leave.    Nurse let Officer know that TDO was already filled out by Crisis worker yesterday --    Crisis Worker Beth here--aware    Will evaluate and talk with patient this morning.

## 2019-05-08 NOTE — ED Notes (Signed)
Patient given lunch tray.    Patient is calm--awaiting placement--    Continues to be Voluntary at this time.

## 2019-05-08 NOTE — ED Notes (Signed)
Care assumed.      Patient up independently--gait steady    Ate breakfast.    Speaking with mother on phone in the hall at this time.

## 2019-05-08 NOTE — ED Notes (Signed)
Medical Transport here     Transport crew looked through belongings,  Patient given clothes to put on.    Transport has the rest of her belongings.    Medical Transport has EMTALA and copy of the chart.      Patient ambulated out with transport.

## 2019-05-08 NOTE — Progress Notes (Signed)
Mental Health Update: 1:1 with Pt earlier this morning to discuss plan of care.  Pt initially expressed desire for discharge due to concern of long wait time.  She had discussed discharge with her mother via phone for purpose of potentially having her mother transport Pt directly to a psychiatric facility.  At one point Pt stated she want to "just go home."  Writer reviewed with Pt reason for her ED visit, outcome of evaluation and concerns for Pt's safety.  Pt requested writer speak with her mother.  Per Pt's request, writer contacted Pt's mother who expressed frustration regarding her daughter's extended stay in the ED and lack of communication. Writer provided update and answered any related questions which provided some reassurance.  Pt's mother requested to speak with her daughter to encourage her to remain voluntary status.    1:1 with Pt after her conversation with her mother.  Pt stated her willingness to remain voluntary for acute IP treatment and writer resumed the bed search.  [facility], [facility], [facility] and [facility] were at capacity.  Writer contacted [facility], faxed Pt's chart and provided collateral.  Pt was later accepted to [facility] under the care of Dr. Gregory Henderson.  ER nurse was provided with number for report.  Per Pt's request, writer provided mother with update and all parties were in agreement with plan for transfer.

## 2019-05-08 NOTE — ED Notes (Addendum)
Beth from Crisis here obtaining consent form for patient to be admitted to [facility].

## 2020-10-16 ENCOUNTER — Inpatient Hospital Stay: Admit: 2020-10-16 | Payer: PRIVATE HEALTH INSURANCE | Primary: Internal Medicine

## 2020-10-16 ENCOUNTER — Encounter

## 2020-10-16 DIAGNOSIS — M25522 Pain in left elbow: Secondary | ICD-10-CM

## 2020-10-16 DIAGNOSIS — S59902A Unspecified injury of left elbow, initial encounter: Secondary | ICD-10-CM
